# Patient Record
Sex: Male | Born: 1950
Health system: Southern US, Community
[De-identification: ages and names within clinical notes are randomized; demographics above are authoritative.]

## PROBLEM LIST (undated history)

## (undated) DIAGNOSIS — C61 Malignant neoplasm of prostate: Secondary | ICD-10-CM

## (undated) DIAGNOSIS — Z973 Presence of spectacles and contact lenses: Secondary | ICD-10-CM

## (undated) DIAGNOSIS — J301 Allergic rhinitis due to pollen: Secondary | ICD-10-CM

## (undated) DIAGNOSIS — R972 Elevated prostate specific antigen [PSA]: Secondary | ICD-10-CM

## (undated) HISTORY — PX: PROSTATE BIOPSY: SHX241

## (undated) HISTORY — DX: Allergic rhinitis due to pollen: J30.1

## (undated) HISTORY — DX: Elevated prostate specific antigen (PSA): R97.20

---

## 2004-09-27 ENCOUNTER — Emergency Department (HOSPITAL_COMMUNITY): Admission: EM | Admit: 2004-09-27 | Discharge: 2004-09-27 | Payer: Self-pay | Admitting: Emergency Medicine

## 2012-05-29 HISTORY — PX: COLONOSCOPY: SHX174

## 2013-02-20 ENCOUNTER — Encounter: Payer: Self-pay | Admitting: Gastroenterology

## 2013-02-20 ENCOUNTER — Ambulatory Visit (INDEPENDENT_AMBULATORY_CARE_PROVIDER_SITE_OTHER): Payer: 59 | Admitting: Family Medicine

## 2013-02-20 ENCOUNTER — Encounter: Payer: Self-pay | Admitting: Family Medicine

## 2013-02-20 VITALS — BP 130/60 | HR 70 | Temp 97.9°F | Ht 72.0 in | Wt 184.5 lb

## 2013-02-20 DIAGNOSIS — Z0000 Encounter for general adult medical examination without abnormal findings: Secondary | ICD-10-CM

## 2013-02-20 DIAGNOSIS — Z1211 Encounter for screening for malignant neoplasm of colon: Secondary | ICD-10-CM

## 2013-02-20 NOTE — Progress Notes (Signed)
Nature conservation officer at [facility] [address] [place] [address] Phone: [phone] Fax: [phone]  Date:  02/20/2013   Name:  Robert Perez   DOB:  1951/05/10   MRN:  [id number] Gender: male Age: 62 y.o.  Primary Physician:  Hannah Beat, MD  Evaluating MD: Hannah Beat, MD   Chief Complaint: New Patient   History of Present Illness:  Robert Perez is a 62 y.o. pleasant patient who presents with the following:  Lifelong single.  Has one 80 year old daughter. Got her through college. Daughter is working in [place] for [employer] [place]. [employer] since 2004.  Check BS:  Preventative Health Maintenance Visit:  Health Maintenance Summary Reviewed and updated, unless pt declines services.  Tobacco History Reviewed. Alcohol: No concerns, no excessive use Exercise Habits: bike with a road bike STD concerns: no risk or activity to increase risk Drug Use: None Encouraged self-testicular check  Health Maintenance  Topic Date Due  . Tetanus/tdap  01/12/1968  . Colonoscopy  01/12/1999  . Zostavax  01/11/2009  . Influenza Vaccine  12/27/2012    Labs reviewed with the patient.  No results found for this or any previous visit.   There are no active problems to display for this patient.   Past Medical History  Diagnosis Date  . Allergic rhinitis due to pollen     No past surgical history on file.  History   Social History  . Marital Status: Single    Spouse Name: N/A    Number of Children: N/A  . Years of Education: N/A   Occupational History  . Not on file.   Social History Main Topics  . Smoking status: Never Smoker   . Smokeless tobacco: Never Used  . Alcohol Use: No  . Drug Use: No  . Sexual Activity: Not on file   Other Topics Concern  . Not on file   Social History Narrative  . No narrative on file    Family History  Problem Relation Age of Onset  . Diabetes Mother   . Arthritis Father   . Hyperlipidemia Father   .  Hypertension Father   . Kidney disease Father   . Breast cancer Sister   . Colon cancer Maternal Grandfather     Allergies  Allergen Reactions  . Codeine Nausea Only    Medication list has been reviewed and updated.  No outpatient prescriptions prior to visit.   No facility-administered medications prior to visit.    Review of Systems:   General: Denies fever, chills, sweats. No significant weight loss. Eyes: Denies blurring,significant itching ENT: Denies earache, sore throat, and hoarseness. Cardiovascular: Denies chest pains, palpitations, dyspnea on exertion Respiratory: Denies cough, dyspnea at rest,wheeezing Breast: no concerns about lumps GI: Denies nausea, vomiting, diarrhea, constipation, change in bowel habits, abdominal pain, melena, hematochezia GU: Denies penile discharge, ED, urinary flow / outflow problems. No STD concerns. Musculoskeletal: Denies back pain, joint pain Derm: Denies rash, itching Neuro: Denies  paresthesias, frequent falls, frequent headaches Psych: Denies depression, anxiety Endocrine: Denies cold intolerance, heat intolerance, polydipsia Heme: Denies enlarged lymph nodes Allergy: No hayfever   Physical Examination: BP 130/60  Pulse 70  Temp(Src) 97.9 F (36.6 C) (Oral)  Ht 6' (1.829 m)  Wt 184 lb 8 oz (83.689 kg)  BMI 25.02 kg/m2  Ideal Body Weight: Weight in (lb) to have BMI = 25: 183.9   Wt Readings from Last 3 Encounters:  02/20/13 184 lb 8 oz (83.689  kg)    GEN: well developed, well nourished, no acute distress Eyes: conjunctiva and lids normal, PERRLA, EOMI ENT: TM clear, nares clear, oral exam WNL Neck: supple, no lymphadenopathy, no thyromegaly, no JVD Pulm: clear to auscultation and percussion, respiratory effort normal CV: regular rate and rhythm, S1-S2, no murmur, rub or gallop, no bruits, peripheral pulses normal and symmetric, no cyanosis, clubbing, edema or varicosities Chest: no scars, masses GI: soft,  non-tender; no hepatosplenomegaly, masses; active bowel sounds all quadrants GU: no hernia, testicular mass, penile discharge, or prostate enlargement Lymph: no cervical, axillary or inguinal adenopathy MSK: gait normal, muscle tone and strength WNL, no joint swelling, effusions, discoloration, crepitus  SKIN: clear, good turgor, color WNL, no rashes, lesions, or ulcerations Neuro: normal mental status, normal strength, sensation, and motion Psych: alert; oriented to person, place and time, normally interactive and not anxious or depressed in appearance.  Assessment and Plan:  Routine general medical examination at a health care facility  Special screening for malignant neoplasms, colon - Plan: Ambulatory referral to Gastroenterology  The patient's preventative maintenance and recommended screening tests for an annual wellness exam were reviewed in full today. Brought up to date unless services declined.  Counselled on the importance of diet, exercise, and its role in overall health and mortality. The patient's FH and SH was reviewed, including their home life, tobacco status, and drug and alcohol status.   [employer] checks labs for him - i will get copies and see if he needs anything else Colon  Orders Today:  Orders Placed This Encounter  Procedures  . Ambulatory referral to Gastroenterology    Referral Priority:  Routine    Referral Type:  Consultation    Referral Reason:  Specialty Services Required    Requested Specialty:  Gastroenterology    Number of Visits Requested:  1    Updated Medication List: (Includes new medications, updates to list, dose adjustments) No orders of the defined types were placed in this encounter.    Medications Discontinued: There are no discontinued medications.    Signed, Elpidio Galea. Tripton Ned, MD 02/20/2013 2:26 PM

## 2013-02-20 NOTE — Patient Instructions (Addendum)
REFERRAL: GO THE THE FRONT ROOM AT THE ENTRANCE OF OUR CLINIC, NEAR CHECK IN. ASK FOR Robert Perez. SHE WILL HELP YOU SET UP YOUR REFERRAL. DATE: TIME:  

## 2013-02-21 ENCOUNTER — Encounter: Payer: Self-pay | Admitting: Family Medicine

## 2013-04-11 ENCOUNTER — Ambulatory Visit (AMBULATORY_SURGERY_CENTER): Payer: Self-pay

## 2013-04-11 VITALS — Ht 72.0 in | Wt 184.0 lb

## 2013-04-11 DIAGNOSIS — Z1211 Encounter for screening for malignant neoplasm of colon: Secondary | ICD-10-CM

## 2013-04-11 MED ORDER — MOVIPREP 100 G PO SOLR: 1.0000 | Freq: Once | ORAL | Status: DC

## 2013-04-16 ENCOUNTER — Encounter: Payer: Self-pay | Admitting: Gastroenterology

## 2013-05-02 ENCOUNTER — Encounter: Payer: Self-pay | Admitting: Gastroenterology

## 2013-05-02 ENCOUNTER — Ambulatory Visit (AMBULATORY_SURGERY_CENTER): Payer: 59 | Admitting: Gastroenterology

## 2013-05-02 VITALS — BP 121/90 | HR 60 | Temp 96.7°F | Resp 15 | Ht 72.0 in | Wt 184.0 lb

## 2013-05-02 DIAGNOSIS — Z1211 Encounter for screening for malignant neoplasm of colon: Secondary | ICD-10-CM

## 2013-05-02 MED ORDER — SODIUM CHLORIDE 0.9 % IV SOLN: 500.0000 mL | INTRAVENOUS | Status: DC

## 2013-05-02 NOTE — Patient Instructions (Signed)
YOU HAD AN ENDOSCOPIC PROCEDURE TODAY AT THE [facility]: Refer to the procedure report that was given to you for any specific questions about what was found during the examination.  If the procedure report does not answer your questions, please call your gastroenterologist to clarify.  If you requested that your care partner not be given the details of your procedure findings, then the procedure report has been included in a sealed envelope for you to review at your convenience later.  YOU SHOULD EXPECT: Some feelings of bloating in the abdomen. Passage of more gas than usual.  Walking can help get rid of the air that was put into your GI tract during the procedure and reduce the bloating. If you had a lower endoscopy (such as a colonoscopy or flexible sigmoidoscopy) you may notice spotting of blood in your stool or on the toilet paper. If you underwent a bowel prep for your procedure, then you may not have a normal bowel movement for a few days.  DIET: Your first meal following the procedure should be a light meal and then it is ok to progress to your normal diet.  A half-sandwich or bowl of soup is an example of a good first meal.  Heavy or fried foods are harder to digest and may make you feel nauseous or bloated.  Likewise meals heavy in dairy and vegetables can cause extra gas to form and this can also increase the bloating.  Drink plenty of fluids but you should avoid alcoholic beverages for 24 hours.  ACTIVITY: Your care partner should take you home directly after the procedure.  You should plan to take it easy, moving slowly for the rest of the day.  You can resume normal activity the day after the procedure however you should NOT DRIVE or use heavy machinery for 24 hours (because of the sedation medicines used during the test).    SYMPTOMS TO REPORT IMMEDIATELY: A gastroenterologist can be reached at any hour.  During normal business hours, 8:30 AM to 5:00 PM Monday through Friday,  call [phone].  After hours and on weekends, please call the GI answering service at [phone] who will take a message and have the physician on call contact you.   Following lower endoscopy (colonoscopy or flexible sigmoidoscopy):  Excessive amounts of blood in the stool  Significant tenderness or worsening of abdominal pains  Swelling of the abdomen that is new, acute  Fever of 100F or higher    FOLLOW UP: If any biopsies were taken you will be contacted by phone or by letter within the next 1-3 weeks.  Call your gastroenterologist if you have not heard about the biopsies in 3 weeks.  Our staff will call the home number listed on your records the next business day following your procedure to check on you and address any questions or concerns that you may have at that time regarding the information given to you following your procedure. This is a courtesy call and so if there is no answer at the home number and we have not heard from you through the emergency physician on call, we will assume that you have returned to your regular daily activities without incident.  SIGNATURES/CONFIDENTIALITY: You and/or your care partner have signed paperwork which will be entered into your electronic medical record.  These signatures attest to the fact that that the information above on your After Visit Summary has been reviewed and is understood.  Full responsibility of the confidentiality   of this discharge information lies with you and/or your care-partner.     

## 2013-05-02 NOTE — Progress Notes (Signed)
Patient did not experience any of the following events: a burn prior to discharge; a fall within the facility; wrong site/side/patient/procedure/implant event; or a hospital transfer or hospital admission upon discharge from the facility. (G8907) Patient did not have preoperative order for IV antibiotic SSI prophylaxis. (G8918)  

## 2013-05-02 NOTE — Op Note (Signed)
[facility] [address] [place], [address]   COLONOSCOPY PROCEDURE REPORT  PATIENT: Robert Perez, Robert Perez  MR#: [id number] BIRTHDATE: March 23, 1951 , 62  yrs. old GENDER: Male ENDOSCOPIST: Rachael Fee, MD REFERRED [id number] Ward Chatters, M.D. PROCEDURE DATE:  05/02/2013 PROCEDURE:   Colonoscopy, screening First Screening Colonoscopy - Avg.  risk and is 50 yrs.  old or older Yes.  Prior Negative Screening - Now for repeat screening. N/A  History of Adenoma - Now for follow-up colonoscopy & has been > or = to 3 yrs.  N/A  Polyps Removed Today? No.  Recommend repeat exam, <10 yrs? No. ASA CLASS:   Class II INDICATIONS:average risk screening. MEDICATIONS: Fentanyl 75 mcg IV, Versed 6 mg IV, and These medications were titrated to patient response per physician's verbal order  DESCRIPTION OF PROCEDURE:   After the risks benefits and alternatives of the procedure were thoroughly explained, informed consent was obtained.  A digital rectal exam revealed no abnormalities of the rectum.   The LB [id number]  endoscope was introduced through the anus and advanced to the cecum, which was identified by both the appendix and ileocecal valve. No adverse events experienced.   The quality of the prep was excellent.  The instrument was then slowly withdrawn as the colon was fully examined.   COLON FINDINGS: A normal appearing cecum, ileocecal valve, and appendiceal orifice were identified.  The ascending, hepatic flexure, transverse, splenic flexure, descending, sigmoid colon and rectum appeared unremarkable.  No polyps or cancers were seen. Retroflexed views revealed no abnormalities. The time to cecum=3 minutes 53 seconds.  Withdrawal time=9 minutes 42 seconds.  The scope was withdrawn and the procedure completed. COMPLICATIONS: There were no complications.  ENDOSCOPIC IMPRESSION: Normal colon No polyps or cancers  RECOMMENDATIONS: You should continue to follow  colorectal cancer screening guidelines for "routine risk" patients with a repeat colonoscopy in 10 years.   eSigned:  Rachael Fee, MD 05/02/2013 8:50 AM

## 2013-05-05 ENCOUNTER — Telehealth: Payer: Self-pay

## 2013-05-05 NOTE — Telephone Encounter (Signed)
  Follow up Call-  Call back number 05/02/2013  Post procedure Call Back phone  # [phone]   [phone]  Permission to leave phone message Yes     Patient questions:  Do you have a fever, pain , or abdominal swelling? no Pain Score  0 *  Have you tolerated food without any problems? yes  Have you been able to return to your normal activities? yes  Do you have any questions about your discharge instructions: Diet   no Medications  no Follow up visit  no  Do you have questions or concerns about your Care? no  Actions: * If pain score is 4 or above: No action needed, pain <4.

## 2014-04-13 ENCOUNTER — Encounter: Payer: Self-pay | Admitting: Family Medicine

## 2014-04-13 ENCOUNTER — Ambulatory Visit (INDEPENDENT_AMBULATORY_CARE_PROVIDER_SITE_OTHER): Payer: Medicare HMO | Admitting: Family Medicine

## 2014-04-13 ENCOUNTER — Telehealth: Payer: Self-pay | Admitting: Family Medicine

## 2014-04-13 VITALS — BP 158/80 | Temp 98.3°F | Wt 181.2 lb

## 2014-04-13 DIAGNOSIS — R300 Dysuria: Secondary | ICD-10-CM

## 2014-04-13 DIAGNOSIS — N3001 Acute cystitis with hematuria: Secondary | ICD-10-CM

## 2014-04-13 LAB — POCT URINALYSIS DIPSTICK
BILIRUBIN UA: NEGATIVE
Glucose, UA: NEGATIVE
KETONES UA: NEGATIVE
Leukocytes, UA: NEGATIVE
Nitrite, UA: NEGATIVE
Protein, UA: POSITIVE
Urobilinogen, UA: 0.2
pH, UA: 6

## 2014-04-13 MED ORDER — CIPROFLOXACIN HCL 500 MG PO TABS: 500.0000 mg | ORAL_TABLET | Freq: Two times a day (BID) | ORAL | Status: DC

## 2014-04-13 NOTE — Telephone Encounter (Signed)
i don't recall who he is - it is fine with me if it is ok with Dr. Damita Dunnings.

## 2014-04-13 NOTE — Patient Instructions (Signed)
Drink plenty of water and start the antibiotics today.  We'll contact you with your lab report.  Take care.   

## 2014-04-13 NOTE — Telephone Encounter (Signed)
Robert Perez stopped by my desk this morning on his way out to request to have his PCP changed from Dr. Lorelei Pont to Dr. Damita Dunnings. Is this ok with both of you?

## 2014-04-13 NOTE — Progress Notes (Signed)
Pre visit review using our clinic review tool, if applicable. No additional management support is needed unless otherwise documented below in the visit note.  Sx started about 3 weeks ago.  Lower abd pain.  Some testicle pain.  H/o bladder infection prev.  He has a tingling down his penile shaft with urination.  No fevers, no sweats.  No vomiting, no diarrhea.   He would have burning with urination if he drinks soda, but that is longstanding.  Road biker, narrow saddle.  No pain sitting on his bike seat.    Meds, vitals, and allergies reviewed.   ROS: See HPI.  Otherwise, noncontributory.  nad ncat Mmm rrr ctab abd soft, not ttp Testes bilaterally descended without nodularity, tenderness or masses. No scrotal masses or lesions. No penis lesions or urethral discharge.

## 2014-04-13 NOTE — Telephone Encounter (Signed)
Patient Information:  Caller Name: Lance  Phone: [phone]  Patient: Robert Perez, Robert Perez  Gender: Male  DOB: Apr 03, 1951  Age: 63 Years  PCP: Owens Loffler (Family Practice)  Office Follow Up:  Does the office need to follow up with this patient?: No  Instructions For The Office: N/A  RN Note:  Reported also has some difficulty starting urinary stream.  Having regular BM's. Agreed to be seen. No appointments available with Dr Lorelei Pont or Dr Diona Browner.    Symptoms  Reason For Call & Symptoms: Increased frequency of bladder pressure and pelvic pain after drinking sweet beverages or sweet foods for past 2-3 weeks.  Denies  dysuria, frequency or nocturia.  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  Reviewed Health History In EMR: Yes  Reviewed Medications In EMR: Yes  Reviewed Allergies In EMR: Yes  Reviewed Surgeries / Procedures: Yes  Date of Onset of Symptoms: 03/23/2014  Treatments Tried: Increaed water intake, stopped drinking chocolate milk and Gatorade  Treatments Tried Worked: No  Guideline(s) Used:  Urination Pain - Male  Disposition Per Guideline:   See Today in Office  Reason For Disposition Reached:   All other males with painful urination, or patient wants to be seen  Advice Given:  Fluids  : Drink extra fluids (Reason: to produce a dilute, nonirritating urine).  Call Back If:  You become worse.  Patient Will Follow Care Advice:  YES  Appointment Scheduled:  04/13/2014 12:00:00 Appointment Scheduled Provider:  Elsie Stain Brigitte Pulse) [facility])

## 2014-04-14 DIAGNOSIS — N390 Urinary tract infection, site not specified: Secondary | ICD-10-CM | POA: Insufficient documentation

## 2014-04-14 LAB — URINE CULTURE
Colony Count: NO GROWTH
Organism ID, Bacteria: NO GROWTH

## 2014-04-14 NOTE — Assessment & Plan Note (Signed)
Likely dx.  Not at all likely to be prostatitis with his ability to ride his bike. No fevers.  Nontoxic. Check ucx and start cipro.  D/w pt. He agrees.

## 2014-04-14 NOTE — Telephone Encounter (Signed)
Okay with me. Thanks 

## 2014-06-24 ENCOUNTER — Encounter: Payer: 59 | Admitting: Family Medicine

## 2014-06-26 ENCOUNTER — Ambulatory Visit (INDEPENDENT_AMBULATORY_CARE_PROVIDER_SITE_OTHER): Payer: Medicare HMO | Admitting: Internal Medicine

## 2014-06-26 ENCOUNTER — Encounter: Payer: Self-pay | Admitting: Internal Medicine

## 2014-06-26 VITALS — BP 120/74 | HR 60 | Temp 97.8°F | Wt 183.5 lb

## 2014-06-26 DIAGNOSIS — J0100 Acute maxillary sinusitis, unspecified: Secondary | ICD-10-CM | POA: Diagnosis not present

## 2014-06-26 MED ORDER — AMOXICILLIN-POT CLAVULANATE 875-125 MG PO TABS
1.0000 | ORAL_TABLET | Freq: Two times a day (BID) | ORAL | Status: DC
Start: 2014-06-26 — End: 2014-09-03

## 2014-06-26 NOTE — Patient Instructions (Signed)
Sinusitis °Sinusitis is redness, soreness, and puffiness (inflammation) of the air pockets in the bones of your face (sinuses). The redness, soreness, and puffiness can cause air and mucus to get trapped in your sinuses. This can allow germs to grow and cause an infection.  °HOME CARE  °· Drink enough fluids to keep your pee (urine) clear or pale yellow. °· Use a humidifier in your home. °· Run a hot shower to create steam in the bathroom. Sit in the bathroom with the door closed. Breathe in the steam 3-4 times a day. °· Put a warm, moist washcloth on your face 3-4 times a day, or as told by your doctor. °· Use salt water sprays (saline sprays) to wet the thick fluid in your nose. This can help the sinuses drain. °· Only take medicine as told by your doctor. °GET HELP RIGHT AWAY IF:  °· Your pain gets worse. °· You have very bad headaches. °· You are sick to your stomach (nauseous). °· You throw up (vomit). °· You are very sleepy (drowsy) all the time. °· Your face is puffy (swollen). °· Your vision changes. °· You have a stiff neck. °· You have trouble breathing. °MAKE SURE YOU:  °· Understand these instructions. °· Will watch your condition. °· Will get help right away if you are not doing well or get worse. °Document Released: 11/01/2007 Document Revised: 02/07/2012 Document Reviewed: 12/19/2011 °ExitCare® Patient Information ©2015 [employer]. This information is not intended to replace advice given to you by your health care provider. Make sure you discuss any questions you have with your health care provider. ° °

## 2014-06-26 NOTE — Progress Notes (Signed)
Pre visit review using our clinic review tool, if applicable. No additional management support is needed unless otherwise documented below in the visit note. 

## 2014-06-26 NOTE — Progress Notes (Signed)
HPI  Mr. Bordner is a 64 y.o. presenting with c/o facial pain/pressure and nasal congestion x 5 days. Nasal mucous is clear/yellow. +PND. He has had some headaches. Denies cough, SOB, fever, mouth/teeth pain. He has taken OTC Nyquil Cold and Flu Night time relief with some relief - "opens me up" - allows him to sleep at night. Hx of sinus infections - "I get them every year." No sick contacts. Never smoked.  Review of Systems   Past Medical History  Diagnosis Date  . Allergic rhinitis due to pollen     Family History  Problem Relation Age of Onset  . Diabetes Mother   . Arthritis Father   . Hyperlipidemia Father   . Hypertension Father   . Kidney disease Father   . Breast cancer Sister   . Colon cancer Maternal Grandfather   . Pancreatic cancer Neg Hx   . Stomach cancer Neg Hx     History   Social History  . Marital Status: Single    Spouse Name: N/A    Number of Children: N/A  . Years of Education: N/A   Occupational History  . Not on file.   Social History Main Topics  . Smoking status: Never Smoker   . Smokeless tobacco: Never Used  . Alcohol Use: No  . Drug Use: No  . Sexual Activity: Not on file   Other Topics Concern  . Not on file   Social History Narrative   Lifelong single.      Has one 15 year old daughter. Got her through college. Daughter is working in [place] for [employer].      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          Allergies  Allergen Reactions  . Codeine Nausea Only   Constitutional: Positive headache, fatigue. Denies fever or abrupt weight changes.  HEENT:  Positive facial pressure, pain and nasal congestion. Denies sore throat, eye redness, eye pain, ear pain, ringing in the ears, wax buildup, runny nose or bloody nose. Respiratory: Denies cough, difficulty breathing or shortness of breath.  Cardiovascular: Denies chest pain, chest tightness, palpitations or swelling in the hands or feet.   No other specific complaints in a  complete review of systems (except as listed in HPI above).  Objective:   BP 120/74 mmHg  Pulse 60  Temp(Src) 97.8 F (36.6 C) (Oral)  Wt 183 lb 8 oz (83.235 kg)  SpO2 98%  Wt Readings from Last 3 Encounters:  06/26/14 183 lb 8 oz (83.235 kg)  04/13/14 181 lb 4 oz (82.214 kg)  05/02/13 184 lb (83.462 kg)   General: Appears his stated age, well developed, well nourished in NAD. HEENT: Head: normal shape and size, maxillary sinus tenderness noted; Eyes: sclera white, no icterus, conjunctiva pink; Ears: fluid, Tm's inflamed b/l; Nose: mucosa red and moist, septum midline; Throat/Mouth: + PND. Teeth present, mucosa erythematous and moist, no exudate noted, no lesions or ulcerations noted.  Neck: No lymphadenopathy, masses, lumps or thyromegaly present.  Cardiovascular: Normal rate and rhythm. S1,S2 noted.  No murmur, rubs or gallops noted.  Pulmonary/Chest: Normal effort and positive vesicular breath sounds. No respiratory distress. No wheezes, rales or ronchi noted.      Assessment & Plan:   Acute maxillary sinus sinusitis:  Get some rest and drink plenty of water. Flonase 2 sprays each nostril for 3 days and then as needed. eRx for Augmentin x 10 days  RTC as needed or if symptoms persist.

## 2014-09-03 ENCOUNTER — Encounter: Payer: Self-pay | Admitting: Family Medicine

## 2014-09-03 ENCOUNTER — Ambulatory Visit (INDEPENDENT_AMBULATORY_CARE_PROVIDER_SITE_OTHER): Payer: Commercial Managed Care - HMO | Admitting: Family Medicine

## 2014-09-03 ENCOUNTER — Encounter (INDEPENDENT_AMBULATORY_CARE_PROVIDER_SITE_OTHER): Payer: Self-pay

## 2014-09-03 VITALS — BP 122/74 | HR 62 | Temp 97.8°F | Ht 72.0 in | Wt 181.0 lb

## 2014-09-03 DIAGNOSIS — Z125 Encounter for screening for malignant neoplasm of prostate: Secondary | ICD-10-CM | POA: Diagnosis not present

## 2014-09-03 DIAGNOSIS — Z83438 Family history of other disorder of lipoprotein metabolism and other lipidemia: Secondary | ICD-10-CM

## 2014-09-03 DIAGNOSIS — Z131 Encounter for screening for diabetes mellitus: Secondary | ICD-10-CM

## 2014-09-03 DIAGNOSIS — Z8349 Family history of other endocrine, nutritional and metabolic diseases: Secondary | ICD-10-CM

## 2014-09-03 DIAGNOSIS — Z7189 Other specified counseling: Secondary | ICD-10-CM | POA: Insufficient documentation

## 2014-09-03 DIAGNOSIS — Z0000 Encounter for general adult medical examination without abnormal findings: Secondary | ICD-10-CM | POA: Insufficient documentation

## 2014-09-03 DIAGNOSIS — Z23 Encounter for immunization: Secondary | ICD-10-CM

## 2014-09-03 LAB — LIPID PANEL
CHOLESTEROL: 213 mg/dL — AB (ref 0–200)
HDL: 70.9 mg/dL (ref >39.00)
LDL Cholesterol: 130 mg/dL — ABNORMAL HIGH (ref 0–99)
NonHDL: 142.1
TRIGLYCERIDES: 62 mg/dL (ref 0.0–149.0)
Total CHOL/HDL Ratio: 3
VLDL: 12.4 mg/dL (ref 0.0–40.0)

## 2014-09-03 LAB — GLUCOSE, RANDOM: Glucose, Bld: 93 mg/dL (ref 70–99)

## 2014-09-03 LAB — PSA, MEDICARE: PSA: 3.73 ng/mL (ref 0.10–4.00)

## 2014-09-03 NOTE — Patient Instructions (Addendum)
See about getting a tetanus shot either at the pharmacy or at [facility].   Check with your insurance to see if they will cover the shingles shot. I would get a flu shot each fall.   Take care.  Keep exercising.  Glad to see you.

## 2014-09-03 NOTE — Assessment & Plan Note (Signed)
Flu encourraged.  Shingles encouraged PNA done 2016 Tetanus d/w pt.  See AVS.  Colonoscopy 2014 Prostate cancer screening- d/w pt re: pros and cons.  Check PSA today. No LUTS Advance directive- sister Sondra Come designated if patient were incapacitated.  Cognitive function addressed- see scanned forms- and if abnormal then additional documentation follows.

## 2014-09-03 NOTE — Progress Notes (Signed)
Pre visit review using our clinic review tool, if applicable. No additional management support is needed unless otherwise documented below in the visit note.  I have personally reviewed the Medicare Annual Wellness questionnaire and have noted 1. The patient's medical and social history 2. Their use of alcohol, tobacco or illicit drugs 3. Their current medications and supplements 4. The patient's functional ability including ADL's, fall risks, home safety risks and hearing or visual             impairment. 5. Diet and physical activities 6. Evidence for depression or mood disorders  The patients weight, height, BMI have been recorded in the chart and visual acuity is per eye clinic.  I have made referrals, counseling and provided education to the patient based review of the above and I have provided the pt with a written personalized care plan for preventive services.  Provider list updated- see scanned forms.  Routine anticipatory guidance given to patient.  See health maintenance.  Flu encourraged.  Shingles encouraged PNA done 2016 Tetanus d/w pt.  See AVS.  Colonoscopy 2014 Prostate cancer screening- d/w pt re: pros and cons.  Check PSA today. No LUTS Advance directive- sister Sondra Come designated if patient were incapacitated.  Cognitive function addressed- see scanned forms- and if abnormal then additional documentation follows.   PMH and SH reviewed  Meds, vitals, and allergies reviewed.   ROS: See HPI.  Otherwise negative.    GEN: nad, alert and oriented HEENT: mucous membranes moist NECK: supple w/o LA CV: rrr. PULM: ctab, no inc wob ABD: soft, +bs EXT: no edema SKIN: no acute rash

## 2014-09-07 ENCOUNTER — Encounter: Payer: Self-pay | Admitting: *Deleted

## 2014-10-29 ENCOUNTER — Ambulatory Visit (INDEPENDENT_AMBULATORY_CARE_PROVIDER_SITE_OTHER): Payer: Commercial Managed Care - HMO | Admitting: Internal Medicine

## 2014-10-29 ENCOUNTER — Encounter: Payer: Self-pay | Admitting: Internal Medicine

## 2014-10-29 VITALS — BP 122/78 | HR 58 | Temp 98.4°F | Wt 180.0 lb

## 2014-10-29 DIAGNOSIS — J309 Allergic rhinitis, unspecified: Secondary | ICD-10-CM | POA: Diagnosis not present

## 2014-10-29 NOTE — Progress Notes (Signed)
HPI  Pt presents to the clinic today with c/o headache, fatigue, nasal congestion, sore throat and cough. This started 1 week ago. He is blowing clear mucous out of his nose. His cough is non productive. His symptoms seem to be worse first thing in the morning and when he lays down at night. He has had some associated itchy, watery eyes. He denies fever chills or body aches. He has only been taking Zyrtec for the last week. He does have a history of allergies and does enjoy cycling as soon as the weather gets warm enough. He has not had sick contacts.  Review of Systems    Past Medical History  Diagnosis Date  . Allergic rhinitis due to pollen     Family History  Problem Relation Age of Onset  . Diabetes Mother   . Arthritis Father   . Hyperlipidemia Father   . Hypertension Father   . Kidney disease Father   . Breast cancer Sister   . Colon cancer Maternal Grandfather   . Pancreatic cancer Neg Hx   . Stomach cancer Neg Hx   . Prostate cancer Neg Hx   . Cancer Brother   . Bladder Cancer Brother     History   Social History  . Marital Status: Single    Spouse Name: N/A  . Number of Children: N/A  . Years of Education: N/A   Occupational History  . Not on file.   Social History Main Topics  . Smoking status: Never Smoker   . Smokeless tobacco: Never Used  . Alcohol Use: No  . Drug Use: No  . Sexual Activity: Not on file   Other Topics Concern  . Not on file   Social History Narrative   Lifelong single.   Has one adult daughter. Got her through college.  Daughter is working in [place] for [employer].   [place] since 2004. 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    Allergies  Allergen Reactions  . Codeine Nausea Only     Constitutional: Positive headache, fatigue. Denies fever or abrupt weight changes.  HEENT:  Positive nasal congestion and sore throat. Denies eye redness, ear pain, ringing in the ears, wax buildup, runny nose or bloody nose. Respiratory: Positive  cough. Denies difficulty breathing or shortness of breath.  Cardiovascular: Denies chest pain, chest tightness, palpitations or swelling in the hands or feet.   No other specific complaints in a complete review of systems (except as listed in HPI above).  Objective:  BP 122/78 mmHg  Pulse 58  Temp(Src) 98.4 F (36.9 C) (Oral)  Wt 180 lb (81.647 kg)  SpO2 98%   General: Appears his stated age, well developed, well nourished in NAD. HEENT: Head: normal shape and size, no sinus tenderness noted; Eyes: sclera white, no icterus, conjunctiva pink; Ears: Tm's gray and intact, normal light reflex; Nose: mucosa boggy and moist, septum midline; Throat/Mouth: + PND. Teeth present, mucosa pink and moist, no exudate noted, no lesions or ulcerations noted.  Neck: No adenopathy noted. Cardiovascular: Normal rate and rhythm. S1,S2 noted.  No murmur, rubs or gallops noted.  Pulmonary/Chest: Normal effort and positive vesicular breath sounds. No respiratory distress. No wheezes, rales or ronchi noted.      Assessment & Plan:   Allergic Rhinitis  Can use a Neti Pot which can be purchased from your local drug store. Flonase 2 sprays each nostril for 3 days and then as needed. Continue Zyrtec Robitussin as needed for cough Watch for fever,  chills, facial pressure or colored nasal mucous  RTC as needed or if symptoms persist.

## 2014-10-29 NOTE — Progress Notes (Signed)
Pre visit review using our clinic review tool, if applicable. No additional management support is needed unless otherwise documented below in the visit note. 

## 2014-10-29 NOTE — Patient Instructions (Addendum)
Take your Zyrtec at night Start Flonase in each nostril every morning Robitussin every 4-6 hours as directed Allergic Rhinitis Allergic rhinitis is when the mucous membranes in the nose respond to allergens. Allergens are particles in the air that cause your body to have an allergic reaction. This causes you to release allergic antibodies. Through a chain of events, these eventually cause you to release histamine into the blood stream. Although meant to protect the body, it is this release of histamine that causes your discomfort, such as frequent sneezing, congestion, and an itchy, runny nose.  CAUSES  Seasonal allergic rhinitis (hay fever) is caused by pollen allergens that may come from grasses, trees, and weeds. Year-round allergic rhinitis (perennial allergic rhinitis) is caused by allergens such as house dust mites, pet dander, and mold spores.  SYMPTOMS   Nasal stuffiness (congestion).  Itchy, runny nose with sneezing and tearing of the eyes. DIAGNOSIS  Your health care provider can help you determine the allergen or allergens that trigger your symptoms. If you and your health care provider are unable to determine the allergen, skin or blood testing may be used. TREATMENT  Allergic rhinitis does not have a cure, but it can be controlled by:  Medicines and allergy shots (immunotherapy).  Avoiding the allergen. Hay fever may often be treated with antihistamines in pill or nasal spray forms. Antihistamines block the effects of histamine. There are over-the-counter medicines that may help with nasal congestion and swelling around the eyes. Check with your health care provider before taking or giving this medicine.  If avoiding the allergen or the medicine prescribed do not work, there are many new medicines your health care provider can prescribe. Stronger medicine may be used if initial measures are ineffective. Desensitizing injections can be used if medicine and avoidance does not work.  Desensitization is when a patient is given ongoing shots until the body becomes less sensitive to the allergen. Make sure you follow up with your health care provider if problems continue. HOME CARE INSTRUCTIONS It is not possible to completely avoid allergens, but you can reduce your symptoms by taking steps to limit your exposure to them. It helps to know exactly what you are allergic to so that you can avoid your specific triggers. SEEK MEDICAL CARE IF:   You have a fever.  You develop a cough that does not stop easily (persistent).  You have shortness of breath.  You start wheezing.  Symptoms interfere with normal daily activities. Document Released: 02/07/2001 Document Revised: 05/20/2013 Document Reviewed: 01/20/2013 [facility] Patient Information 2015 [place], [place]. This information is not intended to replace advice given to you by your health care provider. Make sure you discuss any questions you have with your health care provider.

## 2015-01-07 ENCOUNTER — Encounter: Payer: Self-pay | Admitting: Family Medicine

## 2015-01-07 ENCOUNTER — Ambulatory Visit (INDEPENDENT_AMBULATORY_CARE_PROVIDER_SITE_OTHER): Payer: Commercial Managed Care - HMO | Admitting: Family Medicine

## 2015-01-07 VITALS — BP 138/58 | HR 65 | Temp 98.2°F | Wt 184.2 lb

## 2015-01-07 DIAGNOSIS — IMO0001 Reserved for inherently not codable concepts without codable children: Secondary | ICD-10-CM

## 2015-01-07 DIAGNOSIS — M609 Myositis, unspecified: Secondary | ICD-10-CM

## 2015-01-07 DIAGNOSIS — M791 Myalgia: Secondary | ICD-10-CM | POA: Diagnosis not present

## 2015-01-07 LAB — COMPREHENSIVE METABOLIC PANEL
ALT: 17 U/L (ref 0–53)
AST: 16 U/L (ref 0–37)
Albumin: 4.4 g/dL (ref 3.5–5.2)
Alkaline Phosphatase: 55 U/L (ref 39–117)
BUN: 19 mg/dL (ref 6–23)
CO2: 30 meq/L (ref 19–32)
Calcium: 10 mg/dL (ref 8.4–10.5)
Chloride: 102 mEq/L (ref 96–112)
Creatinine, Ser: 1.07 mg/dL (ref 0.40–1.50)
GFR: 88.93 mL/min (ref >60.00)
Glucose, Bld: 103 mg/dL — ABNORMAL HIGH (ref 70–99)
Potassium: 4.8 mEq/L (ref 3.5–5.1)
SODIUM: 139 meq/L (ref 135–145)
TOTAL PROTEIN: 7.3 g/dL (ref 6.0–8.3)
Total Bilirubin: 0.6 mg/dL (ref 0.2–1.2)

## 2015-01-07 LAB — CBC WITH DIFFERENTIAL/PLATELET
BASOS ABS: 0 10*3/uL (ref 0.0–0.1)
Basophils Relative: 0.4 % (ref 0.0–3.0)
Eosinophils Absolute: 0 10*3/uL (ref 0.0–0.7)
Eosinophils Relative: 0.4 % (ref 0.0–5.0)
HEMATOCRIT: 46.8 % (ref 39.0–52.0)
Hemoglobin: 15.6 g/dL (ref 13.0–17.0)
LYMPHS ABS: 1.2 10*3/uL (ref 0.7–4.0)
Lymphocytes Relative: 11.5 % — ABNORMAL LOW (ref 12.0–46.0)
MCHC: 33.3 g/dL (ref 30.0–36.0)
MCV: 89.1 fl (ref 78.0–100.0)
Monocytes Absolute: 0.7 10*3/uL (ref 0.1–1.0)
Monocytes Relative: 6.6 % (ref 3.0–12.0)
NEUTROS ABS: 8.4 10*3/uL — AB (ref 1.4–7.7)
Neutrophils Relative %: 81.1 % — ABNORMAL HIGH (ref 43.0–77.0)
Platelets: 266 10*3/uL (ref 150.0–400.0)
RBC: 5.25 Mil/uL (ref 4.22–5.81)
RDW: 15.6 % — AB (ref 11.5–15.5)
WBC: 10.3 10*3/uL (ref 4.0–10.5)

## 2015-01-07 LAB — TSH: TSH: 0.89 u[IU]/mL (ref 0.35–4.50)

## 2015-01-07 LAB — CK: CK TOTAL: 144 U/L (ref 7–232)

## 2015-01-07 NOTE — Patient Instructions (Signed)
Go to the lab on the way out.  We'll contact you with your lab report. This may be PMR but I need to see the labs first.   Take care. Glad to see you.  Make sure to stay well hydrated in the meantime.

## 2015-01-07 NOTE — Progress Notes (Signed)
Pre visit review using our clinic review tool, if applicable. No additional management support is needed unless otherwise documented below in the visit note.  He is back working at [employer] but was off for the summer.  About April of 2016 he started having some aching muscles.  It has stayed the same in the meantime.  "Toothache pain" in the L shoulder, esp at night.  B forearms with aches.  No leg pain.  No hand pain or swelling.  No foot pain.  R handed.  No trauma.  Grip is still good.  No FCNAVD.  He feels good except for the aches.  "I thought it would go away, especially with time off work."  Still biking and his rides are still good, 15 [place] of cycling at a time.  The arm pain happens most days.  No vision changes, no temporal pain.    PMH and SH reviewed  ROS: See HPI, otherwise noncontributory.  Meds, vitals, and allergies reviewed.   nad ncat OP wnl Neck supple, no LA, no tmg rrr ctab abd soft Ext w/o edema No rash L shoulder with normal ROM, no arm drop but pain with int > ext rotation. No pain on supraspinatus testing L AC not ttp on testing.  Large muscle groups in the BUE and BLE not ttp Normal grip, normal radial pulses and cap refill No pain on testing for medial and lateral epicondylitis in BUE

## 2015-01-07 NOTE — Assessment & Plan Note (Signed)
D/w pt. Unclear source.  PMR within the ddx, but could still be cuff sx with possible epicondyle irritation.  No trauma.  No other clear cause seen.  Check basic labs today and then we'll update patient. Basics of PMR d/w pt at OV, should we need to treat for that.

## 2015-01-08 LAB — SEDIMENTATION RATE: Sed Rate: 2 mm/hr (ref 0–22)

## 2015-07-01 DIAGNOSIS — D101 Benign neoplasm of tongue: Secondary | ICD-10-CM | POA: Diagnosis not present

## 2015-08-24 ENCOUNTER — Ambulatory Visit: Payer: Commercial Managed Care - HMO | Admitting: Family Medicine

## 2015-08-25 ENCOUNTER — Ambulatory Visit: Payer: Commercial Managed Care - HMO | Admitting: Family Medicine

## 2015-09-07 ENCOUNTER — Ambulatory Visit (INDEPENDENT_AMBULATORY_CARE_PROVIDER_SITE_OTHER): Payer: Commercial Managed Care - HMO

## 2015-09-07 VITALS — BP 122/72 | HR 76 | Temp 98.5°F | Ht 72.0 in | Wt 184.5 lb

## 2015-09-07 DIAGNOSIS — Z23 Encounter for immunization: Secondary | ICD-10-CM | POA: Diagnosis not present

## 2015-09-07 DIAGNOSIS — Z0000 Encounter for general adult medical examination without abnormal findings: Secondary | ICD-10-CM | POA: Diagnosis not present

## 2015-09-07 DIAGNOSIS — Z1159 Encounter for screening for other viral diseases: Secondary | ICD-10-CM

## 2015-09-07 DIAGNOSIS — E7800 Pure hypercholesterolemia, unspecified: Secondary | ICD-10-CM

## 2015-09-07 DIAGNOSIS — Z125 Encounter for screening for malignant neoplasm of prostate: Secondary | ICD-10-CM

## 2015-09-07 LAB — LIPID PANEL
CHOLESTEROL: 204 mg/dL — AB (ref 0–200)
HDL: 63 mg/dL (ref >39.00)
LDL CALC: 125 mg/dL — AB (ref 0–99)
NONHDL: 140.51
Total CHOL/HDL Ratio: 3
Triglycerides: 78 mg/dL (ref 0.0–149.0)
VLDL: 15.6 mg/dL (ref 0.0–40.0)

## 2015-09-07 LAB — COMPREHENSIVE METABOLIC PANEL
ALBUMIN: 4.3 g/dL (ref 3.5–5.2)
ALK PHOS: 53 U/L (ref 39–117)
ALT: 20 U/L (ref 0–53)
AST: 18 U/L (ref 0–37)
BUN: 19 mg/dL (ref 6–23)
CO2: 32 mEq/L (ref 19–32)
CREATININE: 0.97 mg/dL (ref 0.40–1.50)
Calcium: 9.7 mg/dL (ref 8.4–10.5)
Chloride: 102 mEq/L (ref 96–112)
GFR: 99.39 mL/min (ref >60.00)
GLUCOSE: 97 mg/dL (ref 70–99)
POTASSIUM: 4.5 meq/L (ref 3.5–5.1)
SODIUM: 139 meq/L (ref 135–145)
TOTAL PROTEIN: 7 g/dL (ref 6.0–8.3)
Total Bilirubin: 0.6 mg/dL (ref 0.2–1.2)

## 2015-09-07 LAB — HEPATITIS C ANTIBODY: HCV AB: NEGATIVE

## 2015-09-07 LAB — PSA, MEDICARE: PSA: 6.12 ng/mL — AB (ref 0.10–4.00)

## 2015-09-07 NOTE — Patient Instructions (Signed)
Robert Perez , Thank you for taking time to come for your Medicare Wellness Visit. I appreciate your ongoing commitment to your health goals. Please review the following plan we discussed and let me know if I can assist you in the future.   These are the goals we discussed: Goals    Starting 09/07/2015, I will eat only 1 small bag of potato chips daily.      This is a list of the screening recommended for you and due dates:  Health Maintenance  Topic Date Due  .  Hepatitis C: One time screening is recommended by Center for Disease Control  (CDC) for  adults born from [birth year] through 1967.   Completed  . Tetanus Vaccine  Will due at employer  . Pneumonia vaccines (2 of 2 - PPSV23) Completed  . Flu Shot  12/28/2015  . Colon Cancer Screening  05/03/2023  . Shingles Vaccine  Completed   Preventive Care for Adults  A healthy lifestyle and preventive care can promote health and wellness. Preventive health guidelines for adults include the following key practices.  . A routine yearly physical is a good way to check with your health care provider about your health and preventive screening. It is a chance to share any concerns and updates on your health and to receive a thorough exam.  . Visit your dentist for a routine exam and preventive care every 6 months. Brush your teeth twice a day and floss once a day. Good oral hygiene prevents tooth decay and gum disease.  . The frequency of eye exams is based on your age, health, family medical history, use  of contact lenses, and other factors. Follow your health care provider's ecommendations for frequency of eye exams.  . Eat a healthy diet. Foods like vegetables, fruits, whole grains, low-fat dairy products, and lean protein foods contain the nutrients you need without too many calories. Decrease your intake of foods high in solid fats, added sugars, and salt. Eat the right amount of calories for you. Get information about a proper diet from your health  care provider, if necessary.  . Regular physical exercise is one of the most important things you can do for your health. Most adults should get at least 150 minutes of moderate-intensity exercise (any activity that increases your heart rate and causes you to sweat) each week. In addition, most adults need muscle-strengthening exercises on 2 or more days a week.  Silver Sneakers may be a benefit available to you. To determine eligibility, you may visit the website: www.silversneakers.com or contact program at 3168875586 Mon-Fri between 8AM-8PM.   . Maintain a healthy weight. The body mass index (BMI) is a screening tool to identify possible weight problems. It provides an estimate of body fat based on height and weight. Your health care provider can find your BMI and can help you achieve or maintain a healthy weight.   For adults 20 years and older: ? A BMI below 18.5 is considered underweight. ? A BMI of 18.5 to 24.9 is normal. ? A BMI of 25 to 29.9 is considered overweight. ? A BMI of 30 and above is considered obese.   . Maintain normal blood lipids and cholesterol levels by exercising and minimizing your intake of saturated fat. Eat a balanced diet with plenty of fruit and vegetables. Blood tests for lipids and cholesterol should begin at age 35 and be repeated every 5 years. If your lipid or cholesterol levels are high, you are over 50,  or you are at high risk for heart disease, you may need your cholesterol levels checked more frequently. Ongoing high lipid and cholesterol levels should be treated with medicines if diet and exercise are not working.  . If you smoke, find out from your health care provider how to quit. If you do not use tobacco, please do not start.  . If you choose to drink alcohol, please do not consume more than 2 drinks per day. One drink is considered to be 12 ounces (355 mL) of beer, 5 ounces (148 mL) of wine, or 1.5 ounces (44 mL) of liquor.  . If you are 55-61  years old, ask your health care provider if you should take aspirin to prevent strokes.  . Use sunscreen. Apply sunscreen liberally and repeatedly throughout the day. You should seek shade when your shadow is shorter than you. Protect yourself by wearing long sleeves, pants, a wide-brimmed hat, and sunglasses year round, whenever you are outdoors.  . Once a month, do a whole body skin exam, using a mirror to look at the skin on your back. Tell your health care provider of new moles, moles that have irregular borders, moles that are larger than a pencil eraser, or moles that have changed in shape or color.

## 2015-09-07 NOTE — Progress Notes (Signed)
I reviewed health advisor's note, was available for consultation, and agree with documentation and plan.  

## 2015-09-07 NOTE — Progress Notes (Signed)
Subjective:   Robert Perez is a 65 y.o. male who presents for Medicare Annual/Subsequent preventive examination.   Cardiac Risk Factors include: advanced age (>45men, >72 women);male gender     Objective:    Vitals: BP 122/72 mmHg  Pulse 76  Temp(Src) 98.5 F (36.9 C) (Oral)  Ht 6' (1.829 m)  Wt 184 lb 8 oz (83.689 kg)  BMI 25.02 kg/m2  SpO2 97%  Body mass index is 25.02 kg/(m^2).  Tobacco History  Smoking status  . Never Smoker   Smokeless tobacco  . Never Used     Counseling given: No   Past Medical History  Diagnosis Date  . Allergic rhinitis due to pollen    Past Surgical History  Procedure Laterality Date  . No past surgeries     Family History  Problem Relation Age of Onset  . Diabetes Mother   . Arthritis Father   . Hyperlipidemia Father   . Hypertension Father   . Kidney disease Father   . Breast cancer Sister   . Colon cancer Maternal Grandfather   . Pancreatic cancer Neg Hx   . Stomach cancer Neg Hx   . Prostate cancer Neg Hx   . Cancer Brother   . Bladder Cancer Brother    History  Sexual Activity  . Sexual Activity: No    No outpatient encounter prescriptions on file as of 09/07/2015.   No facility-administered encounter medications on file as of 09/07/2015.    Activities of Daily Living In your present state of health, do you have any difficulty performing the following activities: 09/07/2015  Hearing? N  Vision? N  Difficulty concentrating or making decisions? N  Walking or climbing stairs? N  Dressing or bathing? N  Doing errands, shopping? N  Preparing Food and eating ? N  Using the Toilet? N  In the past six months, have you accidently leaked urine? N  Do you have problems with loss of bowel control? N  Managing your Medications? N  Managing your Finances? N  Housekeeping or managing your Housekeeping? N    Patient Care Team: Tonia Ghent, MD as PCP - General (Family Medicine)   Assessment:    Exercise Activities  and Dietary recommendations Current Exercise Habits: Home exercise routine, Type of exercise: Other - see comments (rides bicycle), Time (Minutes): 60, Frequency (Times/Week): 4, Weekly Exercise (Minutes/Week): 240, Intensity: Moderate, Exercise limited by: None identified  Goals    . Reduce portion size     Starting 09/07/2015, I will eat only 1 small bag of potato chips daily.      Fall Risk Fall Risk  09/07/2015 09/03/2014  Falls in the past year? No Yes  Number falls in past yr: - 1  Injury with Fall? - No   Depression Screen PHQ 2/9 Scores 09/07/2015 09/03/2014  PHQ - 2 Score 0 0    Cognitive Testing MMSE - Mini Mental State Exam 09/07/2015  Orientation to time 5  Orientation to Place 5  Registration 3  Attention/ Calculation 0  Recall 3  Language- name 2 objects 0  Language- repeat 1  Language- follow 3 step command 3  Language- read & follow direction 0  Write a sentence 0  Copy design 0  Total score 20   PLEASE NOTE: A Mini-Cog screen was completed. Maximum score is 20. A value of 0 denotes this part of Folstein MMSE was not completed.  Orientation to Time - Max 5 Orientation to Place - Max 5  Registration - Max 3 Recall - Max 3 Language Repeat - Max 1 Language Follow 3 Step Command - Max 3  Immunization History  Administered Date(s) Administered  . Pneumococcal Conjugate-13 09/03/2014  . Pneumococcal Polysaccharide-23 09/07/2015  . Zoster 10/23/2014   Screening Tests Health Maintenance  Topic Date Due  . TETANUS/TDAP  08/27/2016 (Originally 01/12/1970)  . INFLUENZA VACCINE  12/28/2015  . COLONOSCOPY  05/03/2023  . ZOSTAVAX  Completed  . Hepatitis C Screening  Completed  . PNA vac Low Risk Adult  Completed      Plan:     I have personally reviewed and addressed the Medicare Annual Wellness questionnaire and have noted the following in the patient's chart:  A. Medical and social history B. Use of alcohol, tobacco or illicit drugs  C. Current medications  and supplements D. Functional ability and status E.  Nutritional status F.  Physical activity G. Advance directives H. List of other physicians I.  Hospitalizations, surgeries, and ER visits in previous 12 months J.  [place] - may include hearing, vision, cognitive, depression   L. Referrals and appointments - none  In addition, I have reviewed and discussed with patient certain preventive protocols, quality metrics, and best practice recommendations. A written personalized care plan for preventive services as well as general preventive health recommendations were provided to patient.  See attached scanned questionnaire for additional information.   Signed,   Lindell Noe, MHA, BS, LPN Health Advisor

## 2015-09-07 NOTE — Progress Notes (Signed)
Pre visit review using our clinic review tool, if applicable. No additional management support is needed unless otherwise documented below in the visit note. 

## 2015-09-16 ENCOUNTER — Ambulatory Visit (INDEPENDENT_AMBULATORY_CARE_PROVIDER_SITE_OTHER): Payer: Commercial Managed Care - HMO | Admitting: Family Medicine

## 2015-09-16 ENCOUNTER — Encounter: Payer: Self-pay | Admitting: Family Medicine

## 2015-09-16 VITALS — BP 108/52 | HR 63 | Temp 98.3°F | Ht 72.0 in | Wt 186.5 lb

## 2015-09-16 DIAGNOSIS — R972 Elevated prostate specific antigen [PSA]: Secondary | ICD-10-CM

## 2015-09-16 NOTE — Progress Notes (Signed)
Pre visit review using our clinic review tool, if applicable. No additional management support is needed unless otherwise documented below in the visit note.  Routine f/u.  Labs d/w pt.  Elevated PSA noted.  Minimal LUTS, ie occ weaker stream. Not an acute change.  PSA up from prev level.  Father with prostate cancer later in life, died in his late 17s.  D/w pt.   Other labs unremarkable.   Meds, vitals, and allergies reviewed.   ROS: See HPI.  Otherwise, noncontributory.  GEN: nad, alert and oriented HEENT: mucous membranes moist NECK: supple w/o LA CV: rrr.  no murmur PULM: ctab, no inc wob ABD: soft, +bs EXT: no edema SKIN: no acute rash Prostate gland firm and smooth, no enlargement, nodularity, tenderness, mass, asymmetry or induration.

## 2015-09-16 NOTE — Patient Instructions (Signed)
Marion will call about your referral. See her on the way out.  Take care.  Glad to see you.  

## 2015-09-17 ENCOUNTER — Encounter: Payer: Self-pay | Admitting: Family Medicine

## 2015-09-17 DIAGNOSIS — R972 Elevated prostate specific antigen [PSA]: Secondary | ICD-10-CM | POA: Insufficient documentation

## 2015-09-17 NOTE — Assessment & Plan Note (Signed)
New, d/w pt.  Given FH, would ask for uro input.  D/w pt about PSA in general and possible false positives, but in his case I would go ahead and refer.  He agrees.   I appreciate uro input.

## 2015-10-28 DIAGNOSIS — R972 Elevated prostate specific antigen [PSA]: Secondary | ICD-10-CM | POA: Diagnosis not present

## 2016-02-03 DIAGNOSIS — R972 Elevated prostate specific antigen [PSA]: Secondary | ICD-10-CM | POA: Diagnosis not present

## 2016-04-11 ENCOUNTER — Encounter: Payer: Self-pay | Admitting: Family Medicine

## 2016-04-28 DIAGNOSIS — R972 Elevated prostate specific antigen [PSA]: Secondary | ICD-10-CM | POA: Diagnosis not present

## 2016-05-09 DIAGNOSIS — R972 Elevated prostate specific antigen [PSA]: Secondary | ICD-10-CM | POA: Diagnosis not present

## 2016-05-15 DIAGNOSIS — H5201 Hypermetropia, right eye: Secondary | ICD-10-CM | POA: Diagnosis not present

## 2016-08-08 DIAGNOSIS — R972 Elevated prostate specific antigen [PSA]: Secondary | ICD-10-CM | POA: Diagnosis not present

## 2016-09-15 ENCOUNTER — Ambulatory Visit: Payer: Commercial Managed Care - HMO

## 2016-09-15 ENCOUNTER — Other Ambulatory Visit: Payer: Commercial Managed Care - HMO

## 2016-09-19 ENCOUNTER — Ambulatory Visit (INDEPENDENT_AMBULATORY_CARE_PROVIDER_SITE_OTHER): Payer: Medicare HMO

## 2016-09-19 ENCOUNTER — Encounter: Payer: Commercial Managed Care - HMO | Admitting: Family Medicine

## 2016-09-19 ENCOUNTER — Other Ambulatory Visit: Payer: Self-pay | Admitting: Family Medicine

## 2016-09-19 VITALS — BP 110/80 | HR 60 | Temp 98.0°F | Ht 71.5 in | Wt 187.5 lb

## 2016-09-19 DIAGNOSIS — E785 Hyperlipidemia, unspecified: Secondary | ICD-10-CM | POA: Diagnosis not present

## 2016-09-19 DIAGNOSIS — Z0000 Encounter for general adult medical examination without abnormal findings: Secondary | ICD-10-CM

## 2016-09-19 LAB — COMPREHENSIVE METABOLIC PANEL
ALT: 20 U/L (ref 0–53)
AST: 23 U/L (ref 0–37)
Albumin: 4.1 g/dL (ref 3.5–5.2)
Alkaline Phosphatase: 41 U/L (ref 39–117)
BUN: 19 mg/dL (ref 6–23)
CALCIUM: 9.7 mg/dL (ref 8.4–10.5)
CHLORIDE: 103 meq/L (ref 96–112)
CO2: 31 meq/L (ref 19–32)
Creatinine, Ser: 1.05 mg/dL (ref 0.40–1.50)
GFR: 90.42 mL/min (ref >60.00)
GLUCOSE: 95 mg/dL (ref 70–99)
Potassium: 4.3 mEq/L (ref 3.5–5.1)
Sodium: 138 mEq/L (ref 135–145)
Total Bilirubin: 0.4 mg/dL (ref 0.2–1.2)
Total Protein: 6.9 g/dL (ref 6.0–8.3)

## 2016-09-19 LAB — LIPID PANEL
CHOL/HDL RATIO: 3
Cholesterol: 198 mg/dL (ref 0–200)
HDL: 58.5 mg/dL (ref >39.00)
LDL CALC: 127 mg/dL — AB (ref 0–99)
NONHDL: 139.88
TRIGLYCERIDES: 66 mg/dL (ref 0.0–149.0)
VLDL: 13.2 mg/dL (ref 0.0–40.0)

## 2016-09-19 NOTE — Progress Notes (Signed)
Subjective:   Robert Perez is a 66 y.o. male who presents for Medicare Annual/Subsequent preventive examination.  Review of Systems:  N/A Cardiac Risk Factors include: advanced age (>15men, >55 women);male gender     Objective:    Vitals: BP 110/80 (BP Location: Left Arm, Patient Position: Sitting, Cuff Size: Normal)   Pulse 60   Temp 98 F (36.7 C) (Oral)   Ht 5' 11.5" (1.816 m) Comment: no shoes  Wt 187 lb 8 oz (85 kg)   SpO2 98%   BMI 25.79 kg/m   Body mass index is 25.79 kg/m.  Tobacco History  Smoking Status  . Never Smoker  Smokeless Tobacco  . Never Used     Counseling given: No   Past Medical History:  Diagnosis Date  . Allergic rhinitis due to pollen    Past Surgical History:  Procedure Laterality Date  . NO PAST SURGERIES     Family History  Problem Relation Age of Onset  . Diabetes Mother   . Arthritis Father   . Hyperlipidemia Father   . Hypertension Father   . Kidney disease Father   . Prostate cancer Father     dx'd late in life.   . Breast cancer Sister   . Colon cancer Maternal Grandfather   . Cancer Brother   . Bladder Cancer Brother   . Pancreatic cancer Neg Hx   . Stomach cancer Neg Hx    History  Sexual Activity  . Sexual activity: No    No outpatient encounter prescriptions on file as of 09/19/2016.   No facility-administered encounter medications on file as of 09/19/2016.     Activities of Daily Living In your present state of health, do you have any difficulty performing the following activities: 09/19/2016  Hearing? N  Vision? N  Difficulty concentrating or making decisions? N  Walking or climbing stairs? N  Dressing or bathing? N  Doing errands, shopping? N  Preparing Food and eating ? N  Using the Toilet? N  In the past six months, have you accidently leaked urine? N  Do you have problems with loss of bowel control? N  Managing your Medications? N  Managing your Finances? N  Housekeeping or managing your  Housekeeping? N  Some recent data might be hidden    Patient Care Team: Tonia Ghent, MD as PCP - General (Family Medicine)   Assessment:     Hearing Screening   125Hz  250Hz  500Hz  1000Hz  2000Hz  3000Hz  4000Hz  6000Hz  8000Hz   Right ear:   40 40 40  0    Left ear:   40 40 40  0    Vision Screening Comments: Last vision exam in Jan 2018 at [place] and Dietary recommendations Current Exercise Habits: Home exercise routine, Type of exercise: Other - see comments;strength training/weights;stretching;walking (bicycling), Time (Minutes): 60 (bicycling 90-120 min ), Frequency (Times/Week): 2, Weekly Exercise (Minutes/Week): 120, Intensity: Moderate, Exercise limited by: None identified  Goals    . Reduce portion size          Starting 09/19/2016, I will eat only 1 small bag of potato chips daily and to reduce intake of simple carbohydrates.      Fall Risk Fall Risk  09/19/2016 09/07/2015 09/03/2014  Falls in the past year? No No Yes  Number falls in past yr: - - 1  Injury with Fall? - - No   Depression Screen PHQ 2/9 Scores 09/19/2016 09/07/2015 09/03/2014  PHQ -  2 Score 0 0 0    Cognitive Function MMSE - Mini Mental State Exam 09/19/2016 09/07/2015  Orientation to time 5 5  Orientation to Place 5 5  Registration 3 3  Attention/ Calculation 0 0  Recall 3 3  Language- name 2 objects 0 0  Language- repeat 1 1  Language- follow 3 step command 3 3  Language- read & follow direction 0 0  Write a sentence 0 0  Copy design 0 0  Total score 20 20       PLEASE NOTE: A Mini-Cog screen was completed. Maximum score is 20. A value of 0 denotes this part of Folstein MMSE was not completed or the patient failed this part of the Mini-Cog screening.   Mini-Cog Screening Orientation to Time - Max 5 pts Orientation to Place - Max 5 pts Registration - Max 3 pts Recall - Max 3 pts Language Repeat - Max 1 pts Language Follow 3 Step Command - Max 3  pts   Immunization History  Administered Date(s) Administered  . Influenza-Unspecified 02/08/2016  . Pneumococcal Conjugate-13 09/03/2014  . Pneumococcal Polysaccharide-23 09/07/2015  . Zoster 10/23/2014   Screening Tests Health Maintenance  Topic Date Due  . TETANUS/TDAP  09/25/2017 (Originally 01/12/1970)  . INFLUENZA VACCINE  12/27/2016  . COLONOSCOPY  05/03/2023  . Hepatitis C Screening  Completed  . PNA vac Low Risk Adult  Completed      Plan:     I have personally reviewed and addressed the Medicare Annual Wellness questionnaire and have noted the following in the patient's chart:  A. Medical and social history B. Use of alcohol, tobacco or illicit drugs  C. Current medications and supplements D. Functional ability and status E.  Nutritional status F.  Physical activity G. Advance directives H. List of other physicians I.  Hospitalizations, surgeries, and ER visits in previous 12 months J.  [place] to include hearing, vision, cognitive, depression L. Referrals and appointments - none  In addition, I have reviewed and discussed with patient certain preventive protocols, quality metrics, and best practice recommendations. A written personalized care plan for preventive services as well as general preventive health recommendations were provided to patient.  See attached scanned questionnaire for additional information.   Signed,   Lindell Noe, MHA, BS, LPN Health Coach

## 2016-09-19 NOTE — Progress Notes (Signed)
Pre visit review using our clinic review tool, if applicable. No additional management support is needed unless otherwise documented below in the visit note. 

## 2016-09-19 NOTE — Patient Instructions (Signed)
Robert Perez , Thank you for taking time to come for your Medicare Wellness Visit. I appreciate your ongoing commitment to your health goals. Please review the following plan we discussed and let me know if I can assist you in the future.   These are the goals we discussed: Goals    . Reduce portion size          Starting 09/19/2016, I will eat only 1 small bag of potato chips daily and to reduce intake of simple carbohydrates.       This is a list of the screening recommended for you and due dates:  Health Maintenance  Topic Date Due  . Tetanus Vaccine  09/25/2017*  . Flu Shot  12/27/2016  . Colon Cancer Screening  05/03/2023  .  Hepatitis C: One time screening is recommended by Center for Disease Control  (CDC) for  adults born from [birth year] through 1967.   Completed  . Pneumonia vaccines  Completed  *Topic was postponed. The date shown is not the original due date.   Preventive Care for Adults  A healthy lifestyle and preventive care can promote health and wellness. Preventive health guidelines for adults include the following key practices.  . A routine yearly physical is a good way to check with your health care provider about your health and preventive screening. It is a chance to share any concerns and updates on your health and to receive a thorough exam.  . Visit your dentist for a routine exam and preventive care every 6 months. Brush your teeth twice a day and floss once a day. Good oral hygiene prevents tooth decay and gum disease.  . The frequency of eye exams is based on your age, health, family medical history, use  of contact lenses, and other factors. Follow your health care provider's ecommendations for frequency of eye exams.  . Eat a healthy diet. Foods like vegetables, fruits, whole grains, low-fat dairy products, and lean protein foods contain the nutrients you need without too many calories. Decrease your intake of foods high in solid fats, added sugars, and salt. Eat  the right amount of calories for you. Get information about a proper diet from your health care provider, if necessary.  . Regular physical exercise is one of the most important things you can do for your health. Most adults should get at least 150 minutes of moderate-intensity exercise (any activity that increases your heart rate and causes you to sweat) each week. In addition, most adults need muscle-strengthening exercises on 2 or more days a week.  Silver Sneakers may be a benefit available to you. To determine eligibility, you may visit the website: www.silversneakers.com or contact program at 215-454-6605 Mon-Fri between 8AM-8PM.   . Maintain a healthy weight. The body mass index (BMI) is a screening tool to identify possible weight problems. It provides an estimate of body fat based on height and weight. Your health care provider can find your BMI and can help you achieve or maintain a healthy weight.   For adults 20 years and older: ? A BMI below 18.5 is considered underweight. ? A BMI of 18.5 to 24.9 is normal. ? A BMI of 25 to 29.9 is considered overweight. ? A BMI of 30 and above is considered obese.   . Maintain normal blood lipids and cholesterol levels by exercising and minimizing your intake of saturated fat. Eat a balanced diet with plenty of fruit and vegetables. Blood tests for lipids and cholesterol should begin  at age 59 and be repeated every 5 years. If your lipid or cholesterol levels are high, you are over 50, or you are at high risk for heart disease, you may need your cholesterol levels checked more frequently. Ongoing high lipid and cholesterol levels should be treated with medicines if diet and exercise are not working.  . If you smoke, find out from your health care provider how to quit. If you do not use tobacco, please do not start.  . If you choose to drink alcohol, please do not consume more than 2 drinks per day. One drink is considered to be 12 ounces (355 mL)  of beer, 5 ounces (148 mL) of wine, or 1.5 ounces (44 mL) of liquor.  . If you are 65-7 years old, ask your health care provider if you should take aspirin to prevent strokes.  . Use sunscreen. Apply sunscreen liberally and repeatedly throughout the day. You should seek shade when your shadow is shorter than you. Protect yourself by wearing long sleeves, pants, a wide-brimmed hat, and sunglasses year round, whenever you are outdoors.  . Once a month, do a whole body skin exam, using a mirror to look at the skin on your back. Tell your health care provider of new moles, moles that have irregular borders, moles that are larger than a pencil eraser, or moles that have changed in shape or color.

## 2016-09-19 NOTE — Progress Notes (Signed)
PCP notes:   Health maintenance:  Tetanus - addressed; pt plans to get vaccine at employer  Abnormal screenings:   Hearing - failed  Patient concerns:   Pt reports onset of intermittent lower back pain. Pt states he feels it is arthritis.   Nurse concerns:  None  Next PCP appt:   09/26/16 @ 1400  I reviewed health advisor's note, was available for consultation on the day of service listed in this note, and agree with documentation and plan. Elsie Stain, MD.

## 2016-09-26 ENCOUNTER — Encounter: Payer: Self-pay | Admitting: Family Medicine

## 2016-09-26 ENCOUNTER — Ambulatory Visit (INDEPENDENT_AMBULATORY_CARE_PROVIDER_SITE_OTHER): Payer: Medicare HMO | Admitting: Family Medicine

## 2016-09-26 VITALS — BP 126/70 | HR 83 | Temp 98.2°F | Ht 72.0 in | Wt 189.8 lb

## 2016-09-26 DIAGNOSIS — Z7189 Other specified counseling: Secondary | ICD-10-CM

## 2016-09-26 DIAGNOSIS — R972 Elevated prostate specific antigen [PSA]: Secondary | ICD-10-CM

## 2016-09-26 DIAGNOSIS — M545 Low back pain, unspecified: Secondary | ICD-10-CM

## 2016-09-26 DIAGNOSIS — Z0000 Encounter for general adult medical examination without abnormal findings: Secondary | ICD-10-CM

## 2016-09-26 NOTE — Progress Notes (Signed)
Pre visit review using our clinic review tool, if applicable. No additional management support is needed unless otherwise documented below in the visit note. 

## 2016-09-26 NOTE — Patient Instructions (Signed)
You may need to rotate your mattress.  Try stretching in the AM for your lower back.  I'll await the urology notes.  Take care.  Glad to see you.  Update me as needed.

## 2016-09-26 NOTE — Progress Notes (Signed)
Tetanus - addressed; pt plans to get vaccine at employer.  I'll defer, d/w pt.    Abnormal screenings:  Hearing - failed.  Declined hearing aids.    Pt reports onset of intermittent lower back pain. Pt states he feels it is arthritis. Noted after kneeling down.  He is careful about lifting.  No leg sx.  No bruising.  Just above the belt, B lower back and midline.  Sx clearly better when up and moving.  It doesn't bother him when on the bike.  No FCNAVD.  Hasn't tried ice or heat, hasn't bothered him enough to try those.   ================================================== He has seen urology about his PSA and has f/u pending.  He had some urinary sx after last PNA shot- unclear if that was related.  The urinary sx resolved in the meantime.    Advance directive- his daughter would be designated if patient were incapacitated.  Colonoscopy 2014 Diet and exercise d/w pt.  He has been biking a lot.  Some jogging.    PMH and SH reviewed  ROS: Per HPI unless specifically indicated in ROS section   Meds, vitals, and allergies reviewed.   GEN: nad, alert and oriented HEENT: mucous membranes moist NECK: supple w/o LA CV: rrr.  no murmur PULM: ctab, no inc wob ABD: soft, +bs EXT: no edema SKIN: no acute rash Back not tender to palpation in the midline. No CVA pain. Normal strength and sensation in the bilateral lower extremities

## 2016-09-27 DIAGNOSIS — M549 Dorsalgia, unspecified: Secondary | ICD-10-CM | POA: Insufficient documentation

## 2016-09-27 DIAGNOSIS — Z0000 Encounter for general adult medical examination without abnormal findings: Secondary | ICD-10-CM | POA: Insufficient documentation

## 2016-09-27 NOTE — Assessment & Plan Note (Signed)
Advance directive- his daughter would be designated if patient were incapacitated.

## 2016-09-27 NOTE — Assessment & Plan Note (Signed)
Advance directive- his daughter would be designated if patient were incapacitated.  Colonoscopy 2014 Diet and exercise d/w pt.  He has been biking a lot.  Some jogging.   Tetanus - addressed; pt plans to get vaccine at employer.  I'll defer, d/w pt.   Hearing - failed.  Declined hearing aids.

## 2016-09-27 NOTE — Assessment & Plan Note (Signed)
Discussed with patient about the issues with PSA testing, potentially involving false positive results. Urology is monitoring. He has not yet required a biopsy. I appreciate the help of all involved. >25 minutes spent in face to face time with patient, >50% spent in counselling or coordination of care.

## 2016-09-27 NOTE — Assessment & Plan Note (Signed)
Without flag symptoms. Noted in the a.m. when he is getting out of bed. Better as the day goes on. He may need to rotate his mattress. Discussed with patient about stretching. Normal exam today. Update me as needed.

## 2016-10-27 ENCOUNTER — Encounter: Payer: Self-pay | Admitting: Medical

## 2016-10-27 ENCOUNTER — Ambulatory Visit: Payer: Self-pay | Admitting: Medical

## 2016-10-27 VITALS — BP 135/70 | HR 97 | Temp 97.8°F | Resp 16 | Ht 73.0 in | Wt 185.0 lb

## 2016-10-27 DIAGNOSIS — R05 Cough: Secondary | ICD-10-CM

## 2016-10-27 DIAGNOSIS — J069 Acute upper respiratory infection, unspecified: Secondary | ICD-10-CM

## 2016-10-27 DIAGNOSIS — R059 Cough, unspecified: Secondary | ICD-10-CM

## 2016-10-27 MED ORDER — ALBUTEROL SULFATE HFA 108 (90 BASE) MCG/ACT IN AERS
2.0000 | INHALATION_SPRAY | Freq: Four times a day (QID) | RESPIRATORY_TRACT | 0 refills | Status: DC | PRN
Start: 2016-10-27 — End: 2017-03-09

## 2016-10-27 MED ORDER — AZITHROMYCIN 250 MG PO TABS: ORAL_TABLET | ORAL | 0 refills | Status: DC

## 2016-10-27 NOTE — Patient Instructions (Signed)
Rest , increase fluids. Take medication as prescribed. Return to in  3-5 days if not improving. Sooner if needed.

## 2016-10-27 NOTE — Progress Notes (Signed)
   Subjective:    Patient ID: [name], male    DOB: 10/05/1950, 66 y.o.   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 Took yesterday off because he was unable to sleep from the cough on Wednesday night.  No fever, some shortness of breath with the cough.  Deep breaths increase the cough. Drinking more fluids. Usually bicycles  15-20 [place] per week , has not done so because of not feeling well.   Review of Systems  Constitutional: Positive for fatigue. Negative for chills and fever.  HENT: Negative for congestion, ear pain and sore throat.   Eyes: Negative for discharge and itching.  Respiratory: Positive for cough, shortness of breath and wheezing. Negative for chest tightness.   Cardiovascular: Negative for chest pain.  Gastrointestinal: Negative for diarrhea, nausea and vomiting.  Endocrine: Positive for polyuria. Negative for polydipsia and polyphagia.  Genitourinary: Negative for hematuria.  Skin: Negative for rash.  Allergic/Immunologic: Positive for environmental allergies. Negative for food allergies.  Neurological: Negative for dizziness and speech difficulty.  Hematological: Negative for adenopathy.  Psychiatric/Behavioral: Negative for confusion and hallucinations.   Wheezing on Thursday morning.  Chest pain  right sided after moving a storage tub up into the attic on Saturday. Resolved  In 2 days. None now. Says he has been urinating more since taking in more fluids. Objective:   Physical Exam  Constitutional: He is oriented to person, place, and time. He appears well-developed and well-nourished.  HENT:  Head: Normocephalic and atraumatic.  Right Ear: Hearing, tympanic membrane, external ear and ear canal normal.  Left Ear: Hearing, tympanic membrane, external ear and ear canal normal.  Nose: Nose normal.  Mouth/Throat: Uvula is midline and oropharynx is  clear and moist.  Eyes: Conjunctivae and EOM are normal. Pupils are equal, round, and reactive to light.  Neck: Normal range of motion. Neck supple.  Cardiovascular: Normal rate, regular rhythm and normal heart sounds.   Pulmonary/Chest: Effort normal and breath sounds normal.  Musculoskeletal: Normal range of motion.  Lymphadenopathy:    He has no cervical adenopathy.  Neurological: He is alert and oriented to person, place, and time.  Skin: Skin is warm and dry.  Psychiatric: He has a normal mood and affect. His behavior is normal.  Nursing note and vitals reviewed.  Mild cough on deep inspiration.       Assessment & Plan:  Upper respiratory infection  E-prescribed Z-pak 250 mg take 2 tablets by mouth day 1 then one tablet by mouth days 2-5, take with food,  Proair  2 puff every 6 hours as needed for cough , sob or wheezing. Rest , increase fluids. May continue taking  Mucinex DM take as directed for cough. Return to the clinic in  3-5 days if not improving sooner if worsening.

## 2016-11-07 DIAGNOSIS — R972 Elevated prostate specific antigen [PSA]: Secondary | ICD-10-CM | POA: Diagnosis not present

## 2016-11-14 DIAGNOSIS — R972 Elevated prostate specific antigen [PSA]: Secondary | ICD-10-CM | POA: Diagnosis not present

## 2016-11-14 DIAGNOSIS — N400 Enlarged prostate without lower urinary tract symptoms: Secondary | ICD-10-CM | POA: Diagnosis not present

## 2016-12-05 DIAGNOSIS — C61 Malignant neoplasm of prostate: Secondary | ICD-10-CM | POA: Diagnosis not present

## 2016-12-05 DIAGNOSIS — N4232 Atypical small acinar proliferation of prostate: Secondary | ICD-10-CM | POA: Diagnosis not present

## 2016-12-05 DIAGNOSIS — R972 Elevated prostate specific antigen [PSA]: Secondary | ICD-10-CM | POA: Diagnosis not present

## 2016-12-12 DIAGNOSIS — C61 Malignant neoplasm of prostate: Secondary | ICD-10-CM | POA: Diagnosis not present

## 2016-12-26 DIAGNOSIS — C61 Malignant neoplasm of prostate: Secondary | ICD-10-CM | POA: Diagnosis not present

## 2016-12-27 ENCOUNTER — Encounter: Payer: Self-pay | Admitting: Radiation Oncology

## 2017-01-08 ENCOUNTER — Encounter: Payer: Self-pay | Admitting: Medical Oncology

## 2017-01-08 ENCOUNTER — Encounter: Payer: Self-pay | Admitting: Radiation Oncology

## 2017-01-08 ENCOUNTER — Ambulatory Visit
Admission: RE | Admit: 2017-01-08 | Discharge: 2017-01-08 | Disposition: A | Discharge status: Home / Self Care | Payer: Medicare HMO | Source: Ambulatory Visit | Attending: Radiation Oncology | Admitting: Radiation Oncology

## 2017-01-08 VITALS — BP 143/83 | HR 59 | Temp 98.0°F | Resp 16 | Ht 73.0 in | Wt 189.0 lb

## 2017-01-08 DIAGNOSIS — C61 Malignant neoplasm of prostate: Secondary | ICD-10-CM | POA: Diagnosis not present

## 2017-01-08 DIAGNOSIS — Z510 Encounter for antineoplastic radiation therapy: Secondary | ICD-10-CM | POA: Diagnosis not present

## 2017-01-08 DIAGNOSIS — Z808 Family history of malignant neoplasm of other organs or systems: Secondary | ICD-10-CM | POA: Diagnosis not present

## 2017-01-08 DIAGNOSIS — R972 Elevated prostate specific antigen [PSA]: Secondary | ICD-10-CM | POA: Diagnosis not present

## 2017-01-08 DIAGNOSIS — Z809 Family history of malignant neoplasm, unspecified: Secondary | ICD-10-CM | POA: Insufficient documentation

## 2017-01-08 HISTORY — DX: Malignant neoplasm of prostate: C61

## 2017-01-08 NOTE — Progress Notes (Signed)
GU Location of Tumor / Histology: prostatic adenocarcinoma  If Prostate Cancer, Gleason Score is (3 + 4) and PSA is (7.78) as of 12/05/16. Prostate volume: 33 cc.  [name] was referred by Dr. Renford Dills to Dr. Kathie Rhodes for further evaluation of an elevated PSA.   Biopsies of prostate (if applicable) revealed:    Past/Anticipated interventions by urology, if any: Biopsy, referral to Dr. Tammi Klippel to discuss radioactive seed implant  Past/Anticipated interventions by medical oncology, if any: no  Weight changes, if any: no  Bowel/Bladder complaints, if any: IPSS 7. Denies dysuria, hematuria, or leakage.   Nausea/Vomiting, if any: no  Pain issues, if any:  Denies bony pain.  SAFETY ISSUES:  Prior radiation? no  Pacemaker/ICD? no  Possible current pregnancy? no  Is the patient on methotrexate? no  Current Complaints / other details:  66 year old male. Single. Retired. Breast, bladder and prostate cancer run in the family. Avid cyclist biking.

## 2017-01-08 NOTE — Progress Notes (Signed)
See progress note under physician encounter. 

## 2017-01-08 NOTE — Progress Notes (Signed)
Radiation Oncology         [phone] ________________________________  Initial outpatient Consultation  Name: Robert Perez MRN: [id number]  Date: 01/08/2017  DOB: 1950/08/01  WE:[id number], Elveria Rising, MD  Kathie Rhodes, MD   REFERRING PHYSICIAN: Kathie Rhodes, MD  DIAGNOSIS: 66 y.o.gentleman with Stage T1c adenocarcinoma of the prostate with Gleason Score of 3 + 4, and PSA of 7.78    ICD-10-CM   1. Malignant neoplasm of prostate ([place]) [place] Ambulatory Referral to Genetics  2. Family history of cancer Z80.9 Ambulatory Referral to Genetics    HISTORY OF PRESENT ILLNESS: Robert Perez is a 66 y.o. male with a diagnosis of prostate cancer. He was noted to have an elevated PSA of 7.78 by his primary care physician, Dr. Damita Dunnings.  Accordingly, he was referred for evaluation in urology by Dr. Damita Dunnings to Dr. Karsten Ro. Of note the patient has had a history of PSA dating back to 04/17 in the 6.12 range. On  11/14/16, digital rectal examination was performed revealing no nodule. The patient proceeded to transrectal ultrasound with 12 biopsies of the prostate on 12/05/2016.The prostate volume measured 33 cc. Out of 12 core biopsies, 2 were positive.  The maximum Gleason score was 3+4, and this was seen in the right mid gland laterally.  The patient reviewed the biopsy results with his urologist and he has kindly been referred today for discussion of potential radiation treatment options.   PREVIOUS RADIATION THERAPY: No  PAST MEDICAL HISTORY:  Past Medical History:  Diagnosis Date  . Allergic rhinitis due to pollen   . Prostate cancer ([place])   . PSA elevation       PAST SURGICAL HISTORY: Past Surgical History:  Procedure Laterality Date  . PROSTATE BIOPSY      FAMILY HISTORY:  Family History  Problem Relation Age of Onset  . Diabetes Mother   . Arthritis Father   . Hyperlipidemia Father   . Hypertension Father   . Kidney disease Father   . Prostate cancer Father        dx'd late in life.    . Breast cancer Sister   . Colon cancer Maternal Grandfather   . Cancer Brother   . Bladder Cancer Brother   . Breast cancer Sister   . Breast cancer Sister   . Pancreatic cancer Neg Hx   . Stomach cancer Neg Hx     SOCIAL HISTORY:  Social History   Social History  . Marital status: Single    Spouse name: N/A  . Number of children: N/A  . Years of education: N/A   Occupational History  . Not on file.   Social History Main Topics  . Smoking status: Never Smoker  . Smokeless tobacco: Never Used  . Alcohol use No  . Drug use: No  . Sexual activity: No   Other Topics Concern  . Not on file   Social History Narrative   Lifelong single.   Has one adult daughter.  Daughter is working in [place] for [employer].   [place] since 2004. 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 albuterol (PROVENTIL HFA;VENTOLIN HFA) 108 (90 Base) MCG/ACT inhaler Inhale 2 puffs into the lungs every 6 (six) hours as needed for wheezing or shortness of breath. Or cough (Patient  not taking: Reported on 01/08/2017) 1 Inhaler 0   No current facility-administered medications for this encounter.     REVIEW OF SYSTEMS:  On review of systems, the patient reports that he is doing well overall. He denies any chest pain, shortness of breath, cough, fevers, chills, night sweats, unintended weight changes. He denies any bowel disturbances, and denies abdominal pain, nausea or vomiting. He denies any new musculoskeletal or joint aches or pains. His IPSS was 4, indicating mild urinary symptoms of a weak stream and occasional frequency. He is not sexually active but able to achieve erections. A complete review of systems is obtained and is otherwise negative.    PHYSICAL EXAM:  Wt Readings from Last 3 Encounters:    01/08/17 189 lb (85.7 kg)  10/27/16 185 lb (83.9 kg)  09/26/16 189 lb 12 oz (86.1 kg)   Temp Readings from Last 3 Encounters:  01/08/17 98 F (36.7 C) (Oral)  10/27/16 97.8 F (36.6 C)  09/26/16 98.2 F (36.8 C) (Oral)   BP Readings from Last 3 Encounters:  01/08/17 (!) 143/83  10/27/16 135/70  09/26/16 126/70   Pulse Readings from Last 3 Encounters:  01/08/17 (!) 59  10/27/16 97  09/26/16 83   Pain Assessment Pain Score: 0-No pain/10  In general this is a well appearing African American gentleman in no acute distress. He is alert and oriented x4 and appropriate throughout the examination. HEENT reveals that the patient is normocephalic, atraumatic. EOMs are intact. PERRLA. Skin is intact without any evidence of gross lesions. Cardiovascular exam reveals a regular rate and rhythm, no clicks rubs or murmurs are auscultated. Chest is clear to auscultation bilaterally. Lymphatic assessment is performed and does not reveal any adenopathy in the cervical, supraclavicular, axillary, or inguinal chains. Abdomen has active bowel sounds in all quadrants and is intact. The abdomen is soft, non tender, non distended. Lower extremities are negative for pretibial pitting edema, deep calf tenderness, cyanosis or clubbing.   KPS = 100 100 - Normal; no complaints; no evidence of disease. 90   - Able to carry on normal activity; minor signs or symptoms of disease. 80   - Normal activity with effort; some signs or symptoms of disease. 83   - Cares for self; unable to carry on normal activity or to do active work. 60   - Requires occasional assistance, but is able to care for most of his personal needs. 50   - Requires considerable assistance and frequent medical care. 85   - Disabled; requires special care and assistance. 82   - Severely disabled; hospital admission is indicated although death not imminent. 33   - Very sick; hospital admission necessary; active supportive treatment  necessary. 10   - Moribund; fatal processes progressing rapidly. 0     - Dead  Karnofsky DA, Abelmann [place], Craver LS and [place] J[facility] [phone]) The use of the nitrogen mustards in the palliative treatment of carcinoma: with particular reference to bronchogenic carcinoma Cancer 1 634-56  LABORATORY DATA:  Lab Results  Component Value Date   WBC 10.3 01/07/2015   HGB 15.6 01/07/2015   HCT 46.8 01/07/2015   MCV 89.1 01/07/2015   PLT 266.0 01/07/2015   Lab Results  Component Value Date   NA 138 09/19/2016   K 4.3 09/19/2016   CL 103 09/19/2016   CO2 31 09/19/2016   Lab Results  Component Value Date   ALT 20 09/19/2016   AST 23 09/19/2016   ALKPHOS 41 09/19/2016  BILITOT 0.4 09/19/2016     RADIOGRAPHY: No results found.     IMPRESSION/PLAN: 1. 66 y.o.gentleman with intermediate risk Stage T1c adenocarcinoma of the prostate with Gleason Score of 3 + 4, and PSA of 7.78. We reviewed the findings and workup thus far. We discussed the natural history of prostate cancer. We reviewed the the implications of T-stage, Gleason's Score, and PSA on decision-making and outcomes in prostate cancer.  We discussed radiation treatment in the management of prostate cancer with regard to the logistics and delivery of external beam radiation treatment as well as the logistics and delivery of prostate brachytherapy.  We compared and contrasted each of these approaches and also compared these against prostatectomy. We discussed the risks and benefits of seed implant. Patient is not interested in surgery and expressed interest in prostate brachytherapy. We will share this with Dr. Karsten Ro and move forward with scheduling the procedure in the near future.    2. Possible genetic predisposition to cancer. The patient's family history was reviewed and is suspicious for possible genetic predisposition to malignancy. We will refer him to genetic counseling. He is in agreement.     Carola Rhine,  PAC and   Tyler Pita, MD [facility] Medical Director and Director of Stereotactic Radiosurgery Direct Dial: [phone]  Fax: [phone] [email or web address]  Skype  [email or web address] This document serves as a record of services personally performed by Shona Simpson, PA-C and Tyler Pita, MD. It was created on their behalf by Valeta Harms, a trained medical scribe. The creation of this record is based on the scribe's personal observations and the providers' statements to them. This document has been checked and approved by the attending provider.

## 2017-01-15 ENCOUNTER — Other Ambulatory Visit: Payer: Self-pay | Admitting: Urology

## 2017-01-15 ENCOUNTER — Telehealth: Payer: Self-pay | Admitting: *Deleted

## 2017-01-15 NOTE — Telephone Encounter (Signed)
CALLED PATIENT TO INFORM OF HIS PRE-SEED PLANNING CT FOR 02-02-17 AND HIS IMPLANT ON 03-16-17, SPOKE WITH PATIENT AND HE IS AWARE OF THESE APPTS.

## 2017-01-29 NOTE — Progress Notes (Signed)
  Radiation Oncology         [phone]) [phone] ________________________________  Name: Robert Perez MRN: [id number]  Date: 02/02/2017  DOB: [birth year]  SIMULATION AND TREATMENT PLANNING NOTE PUBIC ARCH STUDY  MG:NOIBBC, Elveria Rising, MD  Tonia Ghent, MD  DIAGNOSIS: 66 y.o.gentleman with Stage T1c adenocarcinoma of the prostate with Gleason Score of 3 + 4, and PSA of 7.78     ICD-10-CM   1. Malignant neoplasm of prostate ([place]) C61     COMPLEX SIMULATION:  The patient presented today for evaluation for possible prostate seed implant. He was brought to the radiation planning suite and placed supine on the CT couch. A 3-dimensional image study set was obtained in upload to the planning computer. There, on each axial slice, I contoured the prostate gland. Then, using three-dimensional radiation planning tools I reconstructed the prostate in view of the structures from the transperineal needle pathway to assess for possible pubic arch interference. In doing so, I did not appreciate any pubic arch interference. Also, the patient's prostate volume was estimated based on the drawn structure. The volume was 38 cc.  Given the pubic arch appearance and prostate volume, patient remains a good candidate to proceed with prostate seed implant. Today, he freely provided informed written consent to proceed.    PLAN: The patient will undergo prostate seed implant.   ________________________________  Sheral Apley. Tammi Klippel, M.D.

## 2017-01-31 ENCOUNTER — Other Ambulatory Visit: Payer: Self-pay | Admitting: Urology

## 2017-02-02 ENCOUNTER — Ambulatory Visit (HOSPITAL_COMMUNITY)
Admission: RE | Admit: 2017-02-02 | Discharge: 2017-02-02 | Disposition: A | Discharge status: Home / Self Care | Payer: Medicare HMO | Source: Ambulatory Visit | Attending: Urology | Admitting: Urology

## 2017-02-02 ENCOUNTER — Ambulatory Visit
Admission: RE | Admit: 2017-02-02 | Discharge: 2017-02-02 | Disposition: A | Discharge status: Home / Self Care | Payer: Medicare HMO | Source: Ambulatory Visit | Attending: Radiation Oncology | Admitting: Radiation Oncology

## 2017-02-02 ENCOUNTER — Encounter: Payer: Self-pay | Admitting: Medical Oncology

## 2017-02-02 DIAGNOSIS — J984 Other disorders of lung: Secondary | ICD-10-CM | POA: Insufficient documentation

## 2017-02-02 DIAGNOSIS — Z01818 Encounter for other preprocedural examination: Secondary | ICD-10-CM | POA: Insufficient documentation

## 2017-02-02 DIAGNOSIS — C61 Malignant neoplasm of prostate: Secondary | ICD-10-CM | POA: Diagnosis not present

## 2017-02-02 DIAGNOSIS — Z510 Encounter for antineoplastic radiation therapy: Secondary | ICD-10-CM | POA: Diagnosis not present

## 2017-02-02 DIAGNOSIS — Z809 Family history of malignant neoplasm, unspecified: Secondary | ICD-10-CM | POA: Diagnosis not present

## 2017-02-27 DIAGNOSIS — C61 Malignant neoplasm of prostate: Secondary | ICD-10-CM | POA: Diagnosis not present

## 2017-02-27 DIAGNOSIS — R972 Elevated prostate specific antigen [PSA]: Secondary | ICD-10-CM | POA: Diagnosis not present

## 2017-03-08 ENCOUNTER — Telehealth: Payer: Self-pay | Admitting: *Deleted

## 2017-03-08 NOTE — Telephone Encounter (Signed)
Called patient to remind of labs for 03-09-17 for implant on 03-16-17, spoke with patient and he is aware of this appt.

## 2017-03-09 ENCOUNTER — Encounter (HOSPITAL_BASED_OUTPATIENT_CLINIC_OR_DEPARTMENT_OTHER): Payer: Self-pay | Admitting: *Deleted

## 2017-03-09 DIAGNOSIS — Z8042 Family history of malignant neoplasm of prostate: Secondary | ICD-10-CM | POA: Diagnosis not present

## 2017-03-09 DIAGNOSIS — Z803 Family history of malignant neoplasm of breast: Secondary | ICD-10-CM | POA: Diagnosis not present

## 2017-03-09 DIAGNOSIS — C61 Malignant neoplasm of prostate: Secondary | ICD-10-CM | POA: Diagnosis not present

## 2017-03-09 DIAGNOSIS — Z8052 Family history of malignant neoplasm of bladder: Secondary | ICD-10-CM | POA: Diagnosis not present

## 2017-03-09 LAB — CBC
HCT: 47.1 % (ref 39.0–52.0)
Hemoglobin: 16.1 g/dL (ref 13.0–17.0)
MCH: 30.1 pg (ref 26.0–34.0)
MCHC: 34.2 g/dL (ref 30.0–36.0)
MCV: 88 fL (ref 78.0–100.0)
Platelets: 253 10*3/uL (ref 150–400)
RBC: 5.35 MIL/uL (ref 4.22–5.81)
RDW: 14.1 % (ref 11.5–15.5)
WBC: 5.3 10*3/uL (ref 4.0–10.5)

## 2017-03-09 LAB — COMPREHENSIVE METABOLIC PANEL
ALT: 17 U/L (ref 17–63)
AST: 19 U/L (ref 15–41)
Albumin: 4.5 g/dL (ref 3.5–5.0)
Alkaline Phosphatase: 51 U/L (ref 38–126)
Anion gap: 5 (ref 5–15)
BUN: 19 mg/dL (ref 6–20)
CO2: 30 mmol/L (ref 22–32)
Calcium: 9.6 mg/dL (ref 8.9–10.3)
Chloride: 103 mmol/L (ref 101–111)
Creatinine, Ser: 1.04 mg/dL (ref 0.61–1.24)
GFR calc Af Amer: >60 mL/min (ref >60)
GFR calc non Af Amer: >60 mL/min (ref >60)
Glucose, Bld: 102 mg/dL — ABNORMAL HIGH (ref 65–99)
Potassium: 4.2 mmol/L (ref 3.5–5.1)
Sodium: 138 mmol/L (ref 135–145)
Total Bilirubin: 1 mg/dL (ref 0.3–1.2)
Total Protein: 7.6 g/dL (ref 6.5–8.1)

## 2017-03-09 LAB — APTT: aPTT: 33 seconds (ref 24–36)

## 2017-03-09 LAB — PROTIME-INR
INR: 0.99
Prothrombin Time: 13 seconds (ref 11.4–15.2)

## 2017-03-09 NOTE — Progress Notes (Signed)
NPO AFTER MN.  ARRIVE AT 0730.  CURRENT LAB RESULTS, CXR, AND EKG IN CHART AND EPIC.  WILL DO FLEET ENEMA  AM DOS.

## 2017-03-14 DIAGNOSIS — C61 Malignant neoplasm of prostate: Secondary | ICD-10-CM | POA: Diagnosis not present

## 2017-03-14 NOTE — H&P (Signed)
CC: I have been diagnosed with prostate cancer.    HPI: Robert Perez is a 66 year-old male with newly diagnosed prostate cancer.    Elevated PSA: He was found to have a PSA of 6.12 in 4/17. His father was diagnosed with prostate cancer in his late 4s. He is an avid cyclist biking about [address] 3-4 times per week and after avoiding cycling and intercourse a repeat PSA was found to have decreased but still remains slightly elevated.    Interval history 12/05/16: He presents today for TRUS/BX do an elevated PSA of 7.78 and a positive family history of prostate cancer. No abnormality was noted on DRE.   Interval history 05/09/16: He reports that he has no new voiding symptoms. He denies any bone pain or unexplained weight loss. He did abstain from intercourse and cycling before his last PSA was drawn.   Interval history 11/14/16: He has been doing well urologically. He just got over bronchitis and has not been cycling. He is also obtained from intercourse prior to having his PSA drawn.   Interval history 12/05/16: He returns today for TRUS/BX due to an elevated PSA of 7.78   Interval history 12/12/16: Elevated PSA of 7.78 with no abnormality noted on DRE but a family history of prostate cancer.  TRUS/BX on 12/05/16: Prostate volume - 33 cc.  Pathology: Adenocarcinoma in 2/12 cores. Gleason score 3+4 = 7 in 50% of one core and 3+3 = 6 in 10% of one core both from the periphery of the right lobe.   Interval history 12/26/16: He returns today to discuss the options for treatment and decide on a treatment plan.   His prostate cancer was diagnosed 12/05/2016. His PSA at his time of diagnosis was 7.78. His cancer was T1c, Gleason score 3+4 = 7 in 50% of one core and 3+3 = 6 in 10% of one core both from the periphery of the right lobe..     ALLERGIES: All Codeine/Pain Meds - Nausea    MEDICATIONS: No Reported Medications     GU PSH: Prostate Needle Biopsy - 12/05/2016      PSH Notes: No Surgical  Problems   NON-GU PSH: Colonoscopy Surgical Pathology, Gross And Microscopic Examination For Prostate Needle - 12/05/2016    GU PMH: Prostate Cancer (Stable), He would like to proceed with radioactive seed implantation and we therefore discussed this in much further detail. He will be scheduled for an appointment with Dr. Tammi Klippel. - 12/26/2016, I went over his pathology report with him today as well as the significance of his Gleason score, number and location of cores positive and percent of cores positive. We then discussed his Partin table results in detail and the significance of these predictions as far as prognosis and need for further workup are concerned. I then discussed with him the various options available including active surveillance and treatment for cure such as radiation and surgery. We briefly discussed the forms of radiation available. I also gave him written information outlining the disease, its workup and the options for treatment for him to review further. , - 12/12/2016 Elevated PSA (Stable), I have discussed with the patient the possibility of blood per rectum, per urethra and in the ejaculate. He was counseled to contact me if he has any difficulties following his prostate biopsy whatsoever. - 12/05/2016, (Worsening), His prostate is smooth and benign but his PSA at 7.78 is higher than it has ever been., - 11/14/2016 (Worsening), Although his prostate remains benign his  PSA has continued to remain elevated and has actually increased slightly since a low of 4.22 in 6/17. It is currently 5.15., - 05/09/2016, Elevated PSA, - 10/28/2015 BPH w/o LUTS (Stable), He has some enlargement of his prostate noted on examination but has no voiding symptoms. - 11/14/2016    NON-GU PMH: Encounter for general adult medical examination without abnormal findings, Encounter for preventive health examination - 10/28/2015    FAMILY HISTORY: malignant neoplasm of breast - Runs In Family malignant neoplasm of  urinary bladder - Runs In Family Prostate Cancer - Runs In Family renal failure - Runs In Family    Notes: DM in mother   SOCIAL HISTORY: Marital Status: Single Preferred Language: English; Ethnicity: Not Hispanic Or Latino; Race: Black or African American Current Smoking Status: Patient has never smoked.   Tobacco Use Assessment Completed: Used Tobacco in last 30 days? Does not use smokeless tobacco. Has never drank.  Does not use drugs. Drinks 1 caffeinated drink per day. Has not had a blood transfusion.     Notes: Single, 1 daughter, Retired, working part time at [employer] in [employer]. Never a smoker   REVIEW OF SYSTEMS:    GU Review Male:   Patient reports burning/ pain with urination. Patient denies frequent urination, hard to postpone urination, get up at night to urinate, leakage of urine, stream starts and stops, trouble starting your stream, have to strain to urinate , erection problems, and penile pain.  Gastrointestinal (Upper):   Patient denies nausea, vomiting, and indigestion/ heartburn.  Gastrointestinal (Lower):   Patient denies diarrhea and constipation.  Constitutional:   Patient denies fever, night sweats, weight loss, and fatigue.  Skin:   Patient reports itching. Patient denies skin rash/ lesion.  Eyes:   Patient denies blurred vision and double vision.  Ears/ Nose/ Throat:   Patient reports sinus problems. Patient denies sore throat.  Hematologic/Lymphatic:   Patient denies swollen glands and easy bruising.  Cardiovascular:   Patient denies leg swelling and chest pains.  Respiratory:   Patient denies cough and shortness of breath.  Endocrine:   Patient denies excessive thirst.  Musculoskeletal:   Patient denies back pain and joint pain.  Neurological:   Patient denies headaches and dizziness.  Psychologic:   Patient denies depression and anxiety.   Notes: anal itching with BM    VITAL SIGNS:    BP 137/77 mmHg  Pulse 57 /min  Temperature 98.0 F / 36.6 C    MULTI-SYSTEM PHYSICAL EXAMINATION:    Constitutional: Well-nourished. No physical deformities. Normally developed. Good grooming.   Neck: Neck symmetrical, not swollen. Normal tracheal position.   Respiratory: Normal breath sounds. No labored breathing, no use of accessory muscles.   Cardiovascular: Regular rate and rhythm. No murmur, no gallop. Normal temperature, normal extremity pulses, no swelling, no varicosities.   Lymphatic: No enlargement, no tenderness of axillae, groin, neck lymph nodes.  Skin: No paleness, no jaundice, no cyanosis. No lesion, no ulcer, no rash.   Neurologic / Psychiatric: Oriented to time, oriented to place, oriented to person. No depression, no anxiety, no agitation.   Gastrointestinal: No mass, no tenderness, no rigidity, non obese abdomen.   Eyes: Normal conjunctivae. Normal eyelids.   Ears, Nose, Mouth, and Throat: Left ear no scars, no lesions, no masses. Right ear no scars, no lesions, no masses. Nose no scars, no lesions, no masses. Normal hearing. Normal lips.   Musculoskeletal: Normal gait and station of head and neck.     PAST  DATA REVIEWED:  Source Of History:  Patient  Urine Test Review:   Urinalysis   11/07/16 08/08/16 04/28/16 02/03/16 10/28/15 09/04/15 09/04/14  PSA  Total PSA 7.78 ng/dl 5.05 ng/dl 5.15  4.48  4.22 ng/dl 6.12 ng/dl 3.73 ng/dl  Free PSA 0.97 ng/dl 0.70 ng/dl  0.73  0.65 ng/dl    % Free PSA 12 % 14 %  16  15 %      02/27/17  Urinalysis  Urine Appearance Clear   Urine Color Yellow   Urine Glucose Neg   Urine Bilirubin Neg   Urine Ketones Neg   Urine Specific Gravity 1.025   Urine Blood 2+   Urine pH 6.5   Urine Protein Neg   Urine Urobilinogen 0.2   Urine Nitrites Neg   Urine Leukocyte Esterase Neg   Urine WBC/hpf NS (Not Seen)   Urine RBC/hpf 0 - 2/hpf   Urine Epithelial Cells 0 - 5/hpf   Urine Bacteria NS (Not Seen)   Urine Mucous Not Present   Urine Yeast NS (Not Seen)   Urine Trichomonas Not Present   Urine  Cystals NS (Not Seen)   Urine Casts NS (Not Seen)   Urine Sperm Not Present    PROCEDURES:          Urinalysis w/Scope - 81001 Dipstick Dipstick Cont'd Micro  Color: Yellow Bilirubin: Neg WBC/hpf: NS (Not Seen)  Appearance: Clear Ketones: Neg RBC/hpf: 0 - 2/hpf  Specific Gravity: 1.025 Blood: 2+ Bacteria: NS (Not Seen)  pH: 6.5 Protein: Neg Cystals: NS (Not Seen)  Glucose: Neg Urobilinogen: 0.2 Casts: NS (Not Seen)    Nitrites: Neg Trichomonas: Not Present    Leukocyte Esterase: Neg Mucous: Not Present      Epithelial Cells: 0 - 5/hpf      Yeast: NS (Not Seen)      Sperm: Not Present    ASSESSMENT/PLAN:  With biopsy-proven organ confined prostate cancer he has elected to proceed with I-125 radioactive seed implant.

## 2017-03-15 ENCOUNTER — Telehealth: Payer: Self-pay | Admitting: *Deleted

## 2017-03-15 NOTE — Telephone Encounter (Signed)
CALLED PATIENT TO REMIND OF IMPLANT FOR 03-16-17, SPOKE WITH PATIENT AND HE IS AWARE OF THIS IMPLANT

## 2017-03-16 ENCOUNTER — Ambulatory Visit (HOSPITAL_BASED_OUTPATIENT_CLINIC_OR_DEPARTMENT_OTHER)
Admission: RE | Admit: 2017-03-16 | Discharge: 2017-03-16 | Disposition: A | Discharge status: Home / Self Care | Payer: Medicare HMO | Source: Ambulatory Visit | Attending: Urology | Admitting: Urology

## 2017-03-16 ENCOUNTER — Encounter (HOSPITAL_BASED_OUTPATIENT_CLINIC_OR_DEPARTMENT_OTHER): Payer: Self-pay

## 2017-03-16 ENCOUNTER — Encounter (HOSPITAL_BASED_OUTPATIENT_CLINIC_OR_DEPARTMENT_OTHER)
Admission: RE | Disposition: A | Discharge status: Home / Self Care | Payer: Self-pay | Source: Ambulatory Visit | Attending: Urology

## 2017-03-16 ENCOUNTER — Ambulatory Visit (HOSPITAL_BASED_OUTPATIENT_CLINIC_OR_DEPARTMENT_OTHER): Payer: Medicare HMO | Admitting: Anesthesiology

## 2017-03-16 ENCOUNTER — Ambulatory Visit (HOSPITAL_COMMUNITY): Payer: Medicare HMO

## 2017-03-16 DIAGNOSIS — Z803 Family history of malignant neoplasm of breast: Secondary | ICD-10-CM | POA: Diagnosis not present

## 2017-03-16 DIAGNOSIS — M549 Dorsalgia, unspecified: Secondary | ICD-10-CM | POA: Diagnosis not present

## 2017-03-16 DIAGNOSIS — Z8042 Family history of malignant neoplasm of prostate: Secondary | ICD-10-CM | POA: Diagnosis not present

## 2017-03-16 DIAGNOSIS — Z8052 Family history of malignant neoplasm of bladder: Secondary | ICD-10-CM | POA: Diagnosis not present

## 2017-03-16 DIAGNOSIS — C61 Malignant neoplasm of prostate: Secondary | ICD-10-CM | POA: Diagnosis not present

## 2017-03-16 DIAGNOSIS — Z01818 Encounter for other preprocedural examination: Secondary | ICD-10-CM

## 2017-03-16 DIAGNOSIS — R972 Elevated prostate specific antigen [PSA]: Secondary | ICD-10-CM | POA: Diagnosis not present

## 2017-03-16 HISTORY — DX: Presence of spectacles and contact lenses: Z97.3

## 2017-03-16 HISTORY — PX: RADIOACTIVE SEED IMPLANT: SHX5150

## 2017-03-16 HISTORY — PX: CYSTOSCOPY: SHX5120

## 2017-03-16 SURGERY — INSERTION, RADIATION SOURCE, PROSTATE
Anesthesia: General

## 2017-03-16 MED ORDER — FENTANYL CITRATE (PF) 100 MCG/2ML IJ SOLN
25.0000 ug | INTRAMUSCULAR | Status: DC | PRN
  Filled 2017-03-16: qty 1

## 2017-03-16 MED ORDER — ONDANSETRON HCL 4 MG/2ML IJ SOLN
INTRAMUSCULAR | Status: AC
  Filled 2017-03-16: qty 2

## 2017-03-16 MED ORDER — EPHEDRINE SULFATE 50 MG/ML IJ SOLN
INTRAMUSCULAR | Status: DC | PRN
  Administered 2017-03-16: 10 mg via INTRAVENOUS

## 2017-03-16 MED ORDER — HYDROCODONE-ACETAMINOPHEN 10-325 MG PO TABS: 1.0000 | ORAL_TABLET | ORAL | 0 refills | Status: DC | PRN

## 2017-03-16 MED ORDER — FENTANYL CITRATE (PF) 100 MCG/2ML IJ SOLN
INTRAMUSCULAR | Status: AC
  Filled 2017-03-16: qty 2

## 2017-03-16 MED ORDER — CIPROFLOXACIN HCL 500 MG PO TABS: 500.0000 mg | ORAL_TABLET | Freq: Two times a day (BID) | ORAL | 0 refills | Status: DC

## 2017-03-16 MED ORDER — EPHEDRINE 5 MG/ML INJ
INTRAVENOUS | Status: AC
  Filled 2017-03-16: qty 10

## 2017-03-16 MED ORDER — KETOROLAC TROMETHAMINE 30 MG/ML IJ SOLN
INTRAMUSCULAR | Status: DC | PRN
  Administered 2017-03-16: 30 mg via INTRAVENOUS

## 2017-03-16 MED ORDER — PROPOFOL 10 MG/ML IV BOLUS
INTRAVENOUS | Status: DC | PRN
  Administered 2017-03-16: 100 mg via INTRAVENOUS
  Administered 2017-03-16: 200 mg via INTRAVENOUS

## 2017-03-16 MED ORDER — MIDAZOLAM HCL 2 MG/2ML IJ SOLN
INTRAMUSCULAR | Status: AC
  Filled 2017-03-16: qty 2

## 2017-03-16 MED ORDER — ONDANSETRON HCL 4 MG/2ML IJ SOLN
INTRAMUSCULAR | Status: DC | PRN
  Administered 2017-03-16: 4 mg via INTRAVENOUS

## 2017-03-16 MED ORDER — FENTANYL CITRATE (PF) 100 MCG/2ML IJ SOLN
INTRAMUSCULAR | Status: DC | PRN
  Administered 2017-03-16 (×8): 25 ug via INTRAVENOUS

## 2017-03-16 MED ORDER — CIPROFLOXACIN IN D5W 400 MG/200ML IV SOLN
INTRAVENOUS | Status: AC
  Filled 2017-03-16: qty 200

## 2017-03-16 MED ORDER — FLEET ENEMA 7-19 GM/118ML RE ENEM
1.0000 | ENEMA | Freq: Once | RECTAL | Status: DC
  Filled 2017-03-16: qty 1

## 2017-03-16 MED ORDER — SODIUM CHLORIDE 0.9 % IR SOLN
Status: DC | PRN
  Administered 2017-03-16: 1000 mL via INTRAVESICAL

## 2017-03-16 MED ORDER — LACTATED RINGERS IV SOLN
INTRAVENOUS | Status: DC
Start: 2017-03-16 — End: 2017-03-16
  Administered 2017-03-16: 1000 mL via INTRAVENOUS
  Administered 2017-03-16 (×2): via INTRAVENOUS
  Filled 2017-03-16: qty 1000

## 2017-03-16 MED ORDER — DEXAMETHASONE SODIUM PHOSPHATE 4 MG/ML IJ SOLN
INTRAMUSCULAR | Status: DC | PRN
  Administered 2017-03-16: 10 mg via INTRAVENOUS

## 2017-03-16 MED ORDER — MIDAZOLAM HCL 5 MG/5ML IJ SOLN
INTRAMUSCULAR | Status: DC | PRN
  Administered 2017-03-16: 2 mg via INTRAVENOUS

## 2017-03-16 MED ORDER — ONDANSETRON HCL 4 MG/2ML IJ SOLN
4.0000 mg | Freq: Once | INTRAMUSCULAR | Status: DC | PRN
  Filled 2017-03-16: qty 2

## 2017-03-16 MED ORDER — PROPOFOL 10 MG/ML IV BOLUS
INTRAVENOUS | Status: AC
  Filled 2017-03-16: qty 40

## 2017-03-16 MED ORDER — KETOROLAC TROMETHAMINE 30 MG/ML IJ SOLN
INTRAMUSCULAR | Status: AC
  Filled 2017-03-16: qty 1

## 2017-03-16 MED ORDER — IOHEXOL 300 MG/ML  SOLN
INTRAMUSCULAR | Status: DC | PRN
  Administered 2017-03-16: 7 mL

## 2017-03-16 MED ORDER — DEXAMETHASONE SODIUM PHOSPHATE 10 MG/ML IJ SOLN
INTRAMUSCULAR | Status: AC
  Filled 2017-03-16: qty 1

## 2017-03-16 MED ORDER — CIPROFLOXACIN IN D5W 400 MG/200ML IV SOLN
400.0000 mg | INTRAVENOUS | Status: AC
  Administered 2017-03-16: 400 mg via INTRAVENOUS
  Filled 2017-03-16: qty 200

## 2017-03-16 MED ORDER — LIDOCAINE 2% (20 MG/ML) 5 ML SYRINGE
INTRAMUSCULAR | Status: AC
  Filled 2017-03-16: qty 5

## 2017-03-16 MED ORDER — LIDOCAINE HCL (CARDIAC) 20 MG/ML IV SOLN
INTRAVENOUS | Status: DC | PRN
  Administered 2017-03-16: 100 mg via INTRAVENOUS

## 2017-03-16 SURGICAL SUPPLY — 30 items
BAG URINE DRAINAGE (UROLOGICAL SUPPLIES) ×3 IMPLANT
BLADE CLIPPER SURG (BLADE) ×3 IMPLANT
CATH FOLEY 2WAY SLVR  5CC 16FR (CATHETERS) ×1
CATH FOLEY 2WAY SLVR 5CC 16FR (CATHETERS) ×2 IMPLANT
CATH ROBINSON RED A/P 20FR (CATHETERS) ×3 IMPLANT
CLOTH BEACON ORANGE TIMEOUT ST (SAFETY) ×3 IMPLANT
COVER BACK TABLE 60X90IN (DRAPES) ×3 IMPLANT
COVER MAYO STAND STRL (DRAPES) ×3 IMPLANT
DRSG TEGADERM 4X4.75 (GAUZE/BANDAGES/DRESSINGS) ×3 IMPLANT
DRSG TEGADERM 8X12 (GAUZE/BANDAGES/DRESSINGS) ×4 IMPLANT
GAUZE SPONGE 4X4 8PLY STR LF (GAUZE/BANDAGES/DRESSINGS) ×1 IMPLANT
GLOVE BIO SURGEON STRL SZ7 (GLOVE) ×2 IMPLANT
GLOVE BIO SURGEON STRL SZ8 (GLOVE) ×3 IMPLANT
GLOVE BIOGEL PI IND STRL 7.5 (GLOVE) IMPLANT
GLOVE BIOGEL PI INDICATOR 7.5 (GLOVE) ×5
GLOVE ECLIPSE 8.0 STRL XLNG CF (GLOVE) IMPLANT
GOWN STRL REUS W/TWL XL LVL3 (GOWN DISPOSABLE) ×3 IMPLANT
HOLDER FOLEY CATH W/STRAP (MISCELLANEOUS) IMPLANT
IMPL SPACEOAR SYSTEM 10ML (MISCELLANEOUS) IMPLANT
IMPLANT SPACEOAR SYSTEM 10ML (MISCELLANEOUS) ×3
IV NS 1000ML (IV SOLUTION) ×3
IV NS 1000ML BAXH (IV SOLUTION) ×2 IMPLANT
KIT RM TURNOVER CYSTO AR (KITS) ×3 IMPLANT
PACK CYSTO (CUSTOM PROCEDURE TRAY) ×3 IMPLANT
SUT BONE WAX W31G (SUTURE) ×3 IMPLANT
SYRINGE 10CC LL (SYRINGE) IMPLANT
TUBE CONNECTING 12X1/4 (SUCTIONS) IMPLANT
UNDERPAD 30X30 INCONTINENT (UNDERPADS AND DIAPERS) ×6 IMPLANT
WATER STERILE IRR 500ML POUR (IV SOLUTION) ×3 IMPLANT
selectSeed I-125 ×1 IMPLANT

## 2017-03-16 NOTE — Anesthesia Postprocedure Evaluation (Signed)
Anesthesia Post Note  Patient: Robert Perez  Procedure(s) Performed: RADIOACTIVE SEED IMPLANT/BRACHYTHERAPY IMPLANT WITH SPACE OAR INSTILLATION (N/A ) CYSTOSCOPY     Patient location during evaluation: PACU Anesthesia Type: General Level of consciousness: awake, awake and alert and oriented Pain management: pain level controlled Vital Signs Assessment: post-procedure vital signs reviewed and stable Respiratory status: spontaneous breathing, respiratory function stable and nonlabored ventilation Cardiovascular status: blood pressure returned to baseline Anesthetic complications: no    Last Vitals:  Vitals:   03/16/17 1300 03/16/17 1355  BP: 132/83 119/64  Pulse: (!) 48 (!) 48  Resp: 13 16  Temp:  (!) 36 C  SpO2: 100% 100%    Last Pain:  Vitals:   03/16/17 1355  TempSrc: Oral  PainSc:                  Quintin Hjort COKER

## 2017-03-16 NOTE — Op Note (Signed)
PATIENT:  [name]  PRE-OPERATIVE DIAGNOSIS:  Adenocarcinoma of the prostate  POST-OPERATIVE DIAGNOSIS:  Same  PROCEDURE:  1. I-125 radioactive seed implantation 2. Cystoscopy  3. Placement of SpaceOAR  SURGEON:  Surgeon(s): Claybon Jabs  Radiation oncologist: Dr. Tyler Pita  ANESTHESIA:  General  EBL:  Minimal  DRAINS: none  INDICATION: HOLMAN BONSIGNORE is a 66 year old male with biopsy-proven adenocarcinoma the prostate who has elected to proceed with radioactive seed implantation for definitive management.  Description of procedure: After informed consent the patient was brought to the major OR, placed on the table and administered general anesthesia. He was then moved to the modified lithotomy position with his perineum perpendicular to the floor. His perineum and genitalia were then sterilely prepped. An official timeout was then performed. A 16 French Foley catheter was then placed in the bladder and filled with dilute contrast, a rectal tube was placed in the rectum and the transrectal ultrasound probe was placed in the rectum and affixed to the stand. He was then sterilely draped.  Real time ultrasonography was used along with the seed planning software On[employer] Prostate vs. 4.2.2.4. This was used to develop the seed plan including the number of needles as well as number of seeds required for complete and adequate coverage. Real-time ultrasonography was then used along with the previously developed plan and the Nucletron device to implant a total of 68 seeds using 22 needles. This proceeded without difficulty or complication.   I then proceeded with placement of SpaceOARby introducing a needle with the bevel angled inferiorly approximately 2 cm superior to the anus. This was angled downward and under direct ultrasound was placed within the space between the prostatic capsule and rectum. This was confirmed with a small amount of sterile saline injected and this was  performed under direct ultrasound. I then attached the SpaceOARto the needle and injected this in the space between the prostate and rectum with good placement noted.  A Foley catheter was then removed as well as the transrectal ultrasound probe and rectal probe. Flexible cystoscopy was then performed using the 17 French flexible scope which revealed a normal urethra throughout its length down to the sphincter which appeared intact. The prostatic urethra revealed bilobar hypertrophy but no evidence of obstruction, seeds, spacers or lesions. The bladder was then entered and fully and systematically inspected. The ureteral orifices were noted to be of normal configuration and position. The mucosa revealed no evidence of tumors. There were also no stones identified within the bladder. I noted no seeds or spacers on the floor of the bladder and retroflexion of the scope revealed no seeds protruding from the base of the prostate.  The cystoscope was then removed and the patient was awakened and taken to recovery room in stable and satisfactory condition. He tolerated procedure well and there were no intraoperative complications.

## 2017-03-16 NOTE — Anesthesia Preprocedure Evaluation (Signed)

## 2017-03-16 NOTE — Anesthesia Procedure Notes (Signed)
Procedure Name: LMA Insertion Date/Time: 03/16/2017 9:45 AM Performed by: Justice Rocher Pre-anesthesia Checklist: Patient identified, Emergency Drugs available, Suction available and Patient being monitored Patient Re-evaluated:Patient Re-evaluated prior to induction Oxygen Delivery Method: Circle system utilized Preoxygenation: Pre-oxygenation with 100% oxygen Induction Type: IV induction Ventilation: Mask ventilation without difficulty LMA: LMA inserted LMA Size: 5.0 Number of attempts: 1 Airway Equipment and Method: Bite block Placement Confirmation: positive ETCO2 and breath sounds checked- equal and bilateral Tube secured with: Tape Dental Injury: Teeth and Oropharynx as per pre-operative assessment

## 2017-03-16 NOTE — Progress Notes (Signed)
  Radiation Oncology         [phone] ________________________________  Name: KYLOR VALVERDE MRN: [id number]  Date: 03/16/2017  DOB: 04/25/1951       Prostate Seed Implant  NL:ZJQBHA, Elveria Rising, MD  No ref. provider found  DIAGNOSIS: 66 y.o.gentleman with Stage T1c adenocarcinoma of the prostate with Gleason Score of 3 + 4, and PSA of 7.78     ICD-10-CM   1. Pre-op testing Z01.818 DG Chest 2 View    DG Chest 2 View   PROCEDURE: Insertion of radioactive I-125 seeds into the prostate gland.  RADIATION DOSE: 145 Gy, definitive therapy.  TECHNIQUE: KAMARRI LOVVORN was brought to the operating room with the urologist. He was placed in the dorsolithotomy position. He was catheterized and a rectal tube was inserted. The perineum was shaved, prepped and draped. The ultrasound probe was then introduced into the rectum to see the prostate gland.  TREATMENT DEVICE: A needle grid was attached to the ultrasound probe stand and anchor needles were placed.  3D PLANNING: The prostate was imaged in 3D using a sagittal sweep of the prostate probe. These images were transferred to the planning computer. There, the prostate, urethra and rectum were defined on each axial reconstructed image. Then, the software created an optimized 3D plan and a few seed positions were adjusted. The quality of the plan was reviewed using [facility] information for the target and the following two organs at risk:  Urethra and Rectum.  Then the accepted plan was uploaded to the seed Selectron afterloading unit.  PROSTATE VOLUME STUDY:  Using transrectal ultrasound the volume of the prostate was verified to be 42 cc.  SPECIAL TREATMENT PROCEDURE/SUPERVISION AND HANDLING: The Nucletron FIRST system was used to place the needles under sagittal guidance. A total of 22 needles were used to deposit 68 seeds in the prostate gland. The individual seed activity was 0.459 mCi.  SpaceOAR:  Yes  COMPLEX SIMULATION: At the end of the procedure,  an anterior radiograph of the pelvis was obtained to document seed positioning and count. Cystoscopy was performed to check the urethra and bladder.  MICRODOSIMETRY: At the end of the procedure, the patient was emitting 0.124 mR/hr at 1 meter. Accordingly, he was considered safe for hospital discharge.  PLAN: The patient will return to the radiation oncology clinic for post implant CT dosimetry in three weeks.   ________________________________  Sheral Apley Tammi Klippel, M.D.

## 2017-03-16 NOTE — Transfer of Care (Signed)
Immediate Anesthesia Transfer of Care Note  Patient: Robert Perez  Procedure(s) Performed: Procedure(s) (LRB): RADIOACTIVE SEED IMPLANT/BRACHYTHERAPY IMPLANT WITH SPACE OAR INSTILLATION (N/A) CYSTOSCOPY  Patient Location: PACU  Anesthesia Type: General  Level of Consciousness: awake, sedated, patient cooperative and responds to stimulation  Airway & Oxygen Therapy: Patient Spontanous Breathing and Patient connected to [place] oxygen  Post-op Assessment: Report given to PACU RN, Post -op Vital signs reviewed and stable and Patient moving all extremities  Post vital signs: Reviewed and stable  Complications: No apparent anesthesia complications

## 2017-03-16 NOTE — Discharge Instructions (Signed)
No advil, aleve, motrin, ibuprofen until 5 pm today  DISCHARGE INSTRUCTIONS FOR PROSTATE SEED IMPLANTATION  Antibiotics You may be given a prescription for an antibiotic to take when you arrive home. If so, be sure to take every tablet in the bottle, even if you are feeling better before the prescription is finished. If you begin itching, notice a rash or start to swell on your trunk, arms, legs and/or throat, immediately stop taking the antibiotic and call your Urologist. Diet Resume your usual diet when you return home. To keep your bowels moving easily and softly, drink prune, apple and cranberry juice at room temperature. You may also take a stool softener, such as Colace, which is available without prescription at local pharmacies. Daily activities ? No driving or heavy lifting for at least two days after the implant. ? No bike riding, horseback riding or riding lawn mowers for the first month after the implant. ? Any strenuous physical activity should be approved by your doctor before you resume it. Sexual relations You may resume sexual relations two weeks after the procedure. A condom should be used for the first two weeks. Your semen may be dark brown or black; this is normal and is related bleeding that may have occurred during the implant. Postoperative swelling Expect swelling and bruising of the scrotum and perineum (the area between the scrotum and anus). Both the swelling and the bruising should resolve in l or 2 weeks. Ice packs and over- the-counter medications such as Tylenol, Advil or Aleve may lessen your discomfort. Postoperative urination Most men experience burning on urination and/or urinary frequency. If this becomes bothersome, contact your Urologist.  Medication can be prescribed to relieve these problems.  It is normal to have some blood in your urine for a few days after the implant. Special instructions related to the seeds It is unlikely that you will pass an  Iodine-125 seed in your urine. The seeds are silver in color and are about as large as a grain of rice. If you pass a seed, do not handle it with your fingers. Use a spoon to place it in an envelope or jar in place this in base occluded area such as the garage or basement for return to the radiation clinic at your convenience.  Contact your doctor for ? Temperature greater than 101 F ? Increasing pain ? Inability to urinate Follow-up  You should have follow up with your urologist and radiation oncologist about 3 weeks after the procedure. General information regarding Iodine seeds ? Iodine-125 is a low energy radioactive material. It is not deeply penetrating and loses energy at short distances. Your prostate will absorb the radiation. Objects that are touched or used by the patient do not become radioactive. ? Body wastes (urine and stool) or body fluids (saliva, tears, semen or blood) are not radioactive. ? The Nuclear Regulatory Commission [facility]) has determined that no radiation precautions are needed for patients undergoing Iodine-125 seed implantation. The [facility] states that such patients do not present a risk to the people around them, including young children and pregnant women. However, in keeping with the general principle that radiation exposure should be kept as low reasonably possible, we suggest the following: ? Children and pets should not sit on the patient's lap for the first two (2) weeks after the implant. ? Pregnant (or possibly pregnant) women should avoid prolonged, close contact with the patient for the first two (2) weeks after the implant. ? A distance of three (3)  feet is acceptable. ? At a distance of three (3) feet, there is no limit to the length of time anyone can be with the patient.    Post Anesthesia Home Care Instructions  Activity: Get plenty of rest for the remainder of the day. A responsible individual must stay with you for 24 hours following the procedure.    For the next 24 hours, DO NOT: -Drive a car -Paediatric nurse -Drink alcoholic beverages -Take any medication unless instructed by your physician -Make any legal decisions or sign important papers.  Meals: Start with liquid foods such as gelatin or soup. Progress to regular foods as tolerated. Avoid greasy, spicy, heavy foods. If nausea and/or vomiting occur, drink only clear liquids until the nausea and/or vomiting subsides. Call your physician if vomiting continues.  Special Instructions/Symptoms: Your throat may feel dry or sore from the anesthesia or the breathing tube placed in your throat during surgery. If this causes discomfort, gargle with warm salt water. The discomfort should disappear within 24 hours.  If you had a scopolamine patch placed behind your ear for the management of post- operative nausea and/or vomiting:  1. The medication in the patch is effective for 72 hours, after which it should be removed.  Wrap patch in a tissue and discard in the trash. Wash hands thoroughly with soap and water. 2. You may remove the patch earlier than 72 hours if you experience unpleasant side effects which may include dry mouth, dizziness or visual disturbances. 3. Avoid touching the patch. Wash your hands with soap and water after contact with the patch.

## 2017-03-19 ENCOUNTER — Encounter (HOSPITAL_BASED_OUTPATIENT_CLINIC_OR_DEPARTMENT_OTHER): Payer: Self-pay | Admitting: Urology

## 2017-04-05 ENCOUNTER — Telehealth: Payer: Self-pay | Admitting: *Deleted

## 2017-04-05 ENCOUNTER — Other Ambulatory Visit: Payer: Self-pay | Admitting: Urology

## 2017-04-05 DIAGNOSIS — C61 Malignant neoplasm of prostate: Secondary | ICD-10-CM

## 2017-04-05 NOTE — Telephone Encounter (Signed)
Called patient to remind of post seed appts. For 04-06-17, spoke with patient and he is aware of these appts.

## 2017-04-06 ENCOUNTER — Ambulatory Visit
Admission: RE | Admit: 2017-04-06 | Discharge: 2017-04-06 | Disposition: A | Discharge status: Home / Self Care | Payer: Medicare HMO | Source: Ambulatory Visit | Attending: Radiation Oncology | Admitting: Radiation Oncology

## 2017-04-06 ENCOUNTER — Encounter: Payer: Self-pay | Admitting: Medical Oncology

## 2017-04-06 DIAGNOSIS — Z510 Encounter for antineoplastic radiation therapy: Secondary | ICD-10-CM | POA: Diagnosis not present

## 2017-04-06 DIAGNOSIS — C61 Malignant neoplasm of prostate: Secondary | ICD-10-CM | POA: Insufficient documentation

## 2017-04-06 DIAGNOSIS — R3915 Urgency of urination: Secondary | ICD-10-CM | POA: Diagnosis not present

## 2017-04-06 DIAGNOSIS — Z809 Family history of malignant neoplasm, unspecified: Secondary | ICD-10-CM | POA: Diagnosis not present

## 2017-04-06 NOTE — Progress Notes (Signed)
  Radiation Oncology         [phone] ________________________________  Name: Robert Perez MRN: [id number]  Date: 04/06/2017  DOB: [birth year]  Follow-Up Visit Note  CC: Tonia Ghent, MD  Tonia Ghent, MD  Diagnosis:   Stage T1c adenocarcinoma of the prostate with Gleason Score of 3 + 4, and PSA of 7.78    ICD-10-CM   1. Malignant neoplasm of prostate ([place]) C61     Interval Since Last Radiation:  3 weeks  Narrative:  The patient returns today for routine follow-up.  He is complaining of increased urinary frequency and urinary hesitation symptoms. He filled out a questionnaire regarding urinary function today providing and overall IPSS score of 21 characterizing his symptoms as severe.  His pre-implant score was 4. He denies any bowel symptoms.  ALLERGIES:  is allergic to codeine.  Meds: No current outpatient medications on file.   No current facility-administered medications for this encounter.     Physical Findings: The patient is in no acute distress. Patient is alert and oriented.  weight is 187 lb (84.8 kg). His oral temperature is 98 F (36.7 C). His blood pressure is 134/77 and his pulse is 55 (abnormal). His respiration is 18 and oxygen saturation is 100%. .  No significant changes.  Lab Findings: Lab Results  Component Value Date   WBC 5.3 03/09/2017   HGB 16.1 03/09/2017   HCT 47.1 03/09/2017   MCV 88.0 03/09/2017   PLT 253 03/09/2017    Radiographic Findings:  Patient underwent CT imaging in our clinic for post implant dosimetry. The CT appears to demonstrate an adequate distribution of radioactive seeds throughout the prostate gland. There no seeds in her near the rectum. I suspect the final radiation plan and dosimetry will show appropriate coverage of the prostate gland.   Impression: The patient is recovering from the effects of radiation. His urinary symptoms should gradually improve over the next 4-6 months. We talked about this today. He is  encouraged by his improvement already and is otherwise please with his outcome.   Plan: Today, I spent time talking to the patient about his prostate seed implant and resolving urinary symptoms. We also talked about long-term follow-up for prostate cancer following seed implant. He understands that ongoing PSA determinations and digital rectal exams will help perform surveillance to rule out disease recurrence. He understands what to expect with his PSA measures. Patient was also educated today about some of the long-term effects from radiation including a small risk for rectal bleeding and possibly erectile dysfunction. We talked about some of the general management approaches to these potential complications. However, I did encourage the patient to contact our office or return at any point if he has questions or concerns related to his previous radiation and prostate cancer.  _____________________________________  Sheral Apley. Tammi Klippel, M.D.   This document serves as a record of services personally performed by Tyler Pita, MD. It was created on his behalf by Margit Banda, a trained medical scribe. The creation of this record is based on the scribe's personal observations and the provider's statements to them. This document has been checked and approved by the attending provider.

## 2017-04-06 NOTE — Progress Notes (Signed)
  Radiation Oncology         [phone] ________________________________  Name: TRYSTEN BERNARD MRN: [id number]  Date: 04/06/2017  DOB: 01-15-1951  COMPLEX SIMULATION NOTE  NARRATIVE:  The patient was brought to the [facility] suite today following prostate seed implantation approximately one month ago.  Identity was confirmed.  All relevant records and images related to the planned course of therapy were reviewed.  Then, the patient was set-up supine.  CT images were obtained.  The CT images were loaded into the planning software.  Then the prostate and rectum were contoured.  Treatment planning then occurred.  The implanted iodine 125 seeds were identified by the physics staff for projection of radiation distribution  I have requested : 3D Simulation  I have requested a DVH of the following structures: Prostate and rectum.    ________________________________  Sheral Apley Tammi Klippel, M.D.  This document serves as a record of services personally performed by Tyler Pita, MD. It was created on his behalf by Margit Banda, a trained medical scribe. The creation of this record is based on the scribe's personal observations and the provider's statements to them. This document has been checked and approved by the attending provider.

## 2017-04-06 NOTE — Progress Notes (Signed)
Patient returns to clinic for post seed follow up with Dr. Tammi Klippel. Weight and vitals stable. Patient denies pain. Pre seed IPSS 4. Post seed IPSS 21. Incomplete emptying and intermittency are his biggest complaints. Patient reports mild dysuria. Denies hematuria, leakage or incontinence. Patient seen by Fritz Pickerel, [place] at [facility] earlier and prescribed Flomax to manage said symptoms.   BP 134/77   Pulse (!) 55   Temp 98 F (36.7 C) (Oral)   Resp 18   Wt 187 lb (84.8 kg)   SpO2 100%   BMI 24.67 kg/m  Wt Readings from Last 3 Encounters:  04/06/17 187 lb (84.8 kg)  03/16/17 185 lb (83.9 kg)  01/08/17 189 lb (85.7 kg)

## 2017-04-11 ENCOUNTER — Telehealth: Payer: Self-pay | Admitting: *Deleted

## 2017-04-11 NOTE — Telephone Encounter (Signed)
Spoke with patient and he is aware of the MRI on 04-20-17

## 2017-04-11 NOTE — Telephone Encounter (Signed)
Called patient to inform of MRI for 04-20-17 - arrival time - 3:45 pm @ [facility], no restrictions to test

## 2017-04-20 ENCOUNTER — Ambulatory Visit (HOSPITAL_COMMUNITY)
Admission: RE | Admit: 2017-04-20 | Discharge: 2017-04-20 | Disposition: A | Discharge status: Home / Self Care | Payer: Medicare HMO | Source: Ambulatory Visit | Attending: Urology | Admitting: Urology

## 2017-04-20 DIAGNOSIS — C61 Malignant neoplasm of prostate: Secondary | ICD-10-CM | POA: Diagnosis not present

## 2017-04-25 ENCOUNTER — Encounter: Payer: Self-pay | Admitting: Radiation Oncology

## 2017-04-25 DIAGNOSIS — Z510 Encounter for antineoplastic radiation therapy: Secondary | ICD-10-CM | POA: Diagnosis not present

## 2017-04-25 DIAGNOSIS — C61 Malignant neoplasm of prostate: Secondary | ICD-10-CM | POA: Diagnosis not present

## 2017-05-03 NOTE — Progress Notes (Signed)
  Radiation Oncology         [phone] ________________________________  Name: Robert Perez MRN: [id number]  Date: 04/25/2017  DOB: 1950/09/05  3D Planning Note   Prostate Brachytherapy Post-Implant Dosimetry  Diagnosis: Stage T1c adenocarcinoma of the prostate with Gleason Score of 3 + 4, and PSA of 7.78  Narrative: On a previous date, Robert Perez returned following prostate seed implantation for post implant planning. He underwent CT scan complex simulation to delineate the three-dimensional structures of the pelvis and demonstrate the radiation distribution.  Since that time, the seed localization, and complex isodose planning with dose volume histograms have now been completed.  Results:   Prostate Coverage - The dose of radiation delivered to the 90% or more of the prostate gland (D90) was 89.59% of the prescription dose. This essentially meets our goal of greater than 90%. Rectal Sparing - The volume of rectal tissue receiving the prescription dose or higher was 0 cc. This falls under our thresholds tolerance of 1.0 cc.  Impression: The prostate seed implant appears to show adequate target coverage and appropriate rectal sparing.  Plan:  The patient will continue to follow with urology for ongoing PSA determinations. I would anticipate a high likelihood for local tumor control with minimal risk for rectal morbidity.  ________________________________  Sheral Apley Tammi Klippel, M.D.  This document serves as a record of services personally performed by Tyler Pita, MD. It was created on his behalf by Linward Natal, a trained medical scribe. The creation of this record is based on the scribe's personal observations and the provider's statements to them. This document has been checked and approved by the attending provider.

## 2017-07-04 DIAGNOSIS — R972 Elevated prostate specific antigen [PSA]: Secondary | ICD-10-CM | POA: Diagnosis not present

## 2017-07-10 DIAGNOSIS — R3911 Hesitancy of micturition: Secondary | ICD-10-CM | POA: Diagnosis not present

## 2017-07-10 DIAGNOSIS — N401 Enlarged prostate with lower urinary tract symptoms: Secondary | ICD-10-CM | POA: Diagnosis not present

## 2017-09-04 ENCOUNTER — Ambulatory Visit: Payer: Self-pay

## 2017-09-04 NOTE — Telephone Encounter (Signed)
Patient called in with c/o "bleeding in stool." He says "I saw blood in the stool and on the tissue. I feel a burning sensation and have a bowel movement, soft movement, and then I see the blood. It happened twice, last Thursday and last Saturday. Both movements were soft, not constipated. I had a bowel movement today and didn't see the blood in the stool or on the tissue, and I didn't have the burning sensation." According to protocol, see PCP within 3 days, appointment scheduled for Thursday, 09/06/17 with Dr. Damita Dunnings, care advice given, patient verbalized understanding.   Reason for Disposition . MILD rectal bleeding (more than just a few drops or streaks)  Answer Assessment - Initial Assessment Questions 1. APPEARANCE of BLOOD: "What color is it?" "Is it passed separately, on the surface of the stool, or mixed in with the stool?"      Bright red, mixed in the stool and on tissue 2. AMOUNT: "How much blood was passed?"      Toilet water gets red 3. FREQUENCY: "How many times has blood been passed with the stools?"      Twice; not today 4. ONSET: "When was the blood first seen in the stools?" (Days or weeks)      Last Thursday around noon; Saturday 5. DIARRHEA: "Is there also some diarrhea?" If so, ask: "How many diarrhea stools were passed in past 24 hours?"      No 6. CONSTIPATION: "Do you have constipation?" If so, "How bad is it?"     No 7. RECURRENT SYMPTOMS: "Have you had blood in your stools before?" If so, ask: "When was the last time?" and "What happened that time?"      No 8. BLOOD THINNERS: "Do you take any blood thinners?" (e.g., Coumadin/warfarin, Pradaxa/dabigatran, aspirin)    No 9. OTHER SYMPTOMS: "Do you have any other symptoms?"  (e.g., abdominal pain, vomiting, dizziness, fever)     No 10. PREGNANCY: "Is there any chance you are pregnant?" "When was your last menstrual period?"       N/A  Protocols used: RECTAL BLEEDING-A-AH

## 2017-09-06 ENCOUNTER — Encounter: Payer: Self-pay | Admitting: Family Medicine

## 2017-09-06 ENCOUNTER — Ambulatory Visit (INDEPENDENT_AMBULATORY_CARE_PROVIDER_SITE_OTHER): Payer: Medicare HMO | Admitting: Family Medicine

## 2017-09-06 DIAGNOSIS — M79646 Pain in unspecified finger(s): Secondary | ICD-10-CM | POA: Diagnosis not present

## 2017-09-06 DIAGNOSIS — K625 Hemorrhage of anus and rectum: Secondary | ICD-10-CM | POA: Diagnosis not present

## 2017-09-06 MED ORDER — NITROGLYCERIN 2 % TD OINT: TOPICAL_OINTMENT | TRANSDERMAL | 0 refills | Status: DC

## 2017-09-06 NOTE — Patient Instructions (Addendum)
Ease back into playing bass and try a thumb spica brace to take some of the pressure off.  Try icing after playing.   Use a stool softener or bran as needed to keep your stools soft.  If you have more trouble then try using nitroglycerin locally.    Take care.  Glad to see you.  Update me as needed.

## 2017-09-06 NOTE — Progress Notes (Signed)
He hasn't been able to bike as much recently but is going to get back to it.  D/w pt.   He has been seeing urology in the meantime.  On flomax.  I'll defer.  He agrees.   Itching rectally for the year or so.  He tried OTC tx.  Last week he has a hard stool with burning thereafter.  BRBPR at the time.  No bleeding with a soft stool.  No BRBPR with the last 3 stools.  No pain except with BM.    He doesn't feel sick o/w.  No blood in urine.  No black stools.  No abd pain.  No other bleeding.    He recently had diffuse L thumb pain.  Woke up with pain.  He had been playing bass a lot prior to the pain starting.  His L hand his is fret hand.    Meds, vitals, and allergies reviewed.   ROS: Per HPI unless specifically indicated in ROS section   nad ncat Normal external rectal exam. No fissure seen.  No gross blood.  Normal ROM and inspection of L thumb/hand.

## 2017-09-08 DIAGNOSIS — K625 Hemorrhage of anus and rectum: Secondary | ICD-10-CM | POA: Insufficient documentation

## 2017-09-08 DIAGNOSIS — M79646 Pain in unspecified finger(s): Secondary | ICD-10-CM | POA: Insufficient documentation

## 2017-09-08 NOTE — Assessment & Plan Note (Signed)
Likely had relative overuse with tendinitis related to playing bass.  He can get a spica brace that may help reinforce the thumb.  I talked to him about playing a little more often but a little less per episode.  Update me as needed.  He agrees.

## 2017-09-08 NOTE — Assessment & Plan Note (Signed)
See after visit summary.  He likely has a resolved rectal fissure.  Anatomy discussed with patient. Use a stool softener or bran as needed to keep stools soft.  If more trouble then try using nitroglycerin locally.    If recurrent bleeding that he will update me.  Okay for outpatient follow-up.

## 2017-09-20 ENCOUNTER — Other Ambulatory Visit: Payer: Self-pay | Admitting: Family Medicine

## 2017-09-20 ENCOUNTER — Ambulatory Visit: Payer: Medicare HMO

## 2017-09-20 ENCOUNTER — Ambulatory Visit (INDEPENDENT_AMBULATORY_CARE_PROVIDER_SITE_OTHER): Payer: Medicare HMO

## 2017-09-20 VITALS — BP 120/80 | HR 68 | Temp 98.4°F | Ht 71.5 in | Wt 188.0 lb

## 2017-09-20 DIAGNOSIS — Z0000 Encounter for general adult medical examination without abnormal findings: Secondary | ICD-10-CM | POA: Diagnosis not present

## 2017-09-20 DIAGNOSIS — E785 Hyperlipidemia, unspecified: Secondary | ICD-10-CM

## 2017-09-20 LAB — COMPREHENSIVE METABOLIC PANEL
ALBUMIN: 4.1 g/dL (ref 3.5–5.2)
ALK PHOS: 44 U/L (ref 39–117)
ALT: 16 U/L (ref 0–53)
AST: 16 U/L (ref 0–37)
BILIRUBIN TOTAL: 0.5 mg/dL (ref 0.2–1.2)
BUN: 17 mg/dL (ref 6–23)
CO2: 32 mEq/L (ref 19–32)
CREATININE: 1.01 mg/dL (ref 0.40–1.50)
Calcium: 9.4 mg/dL (ref 8.4–10.5)
Chloride: 104 mEq/L (ref 96–112)
GFR: 94.28 mL/min (ref >60.00)
GLUCOSE: 98 mg/dL (ref 70–99)
Potassium: 4.6 mEq/L (ref 3.5–5.1)
SODIUM: 138 meq/L (ref 135–145)
TOTAL PROTEIN: 6.7 g/dL (ref 6.0–8.3)

## 2017-09-20 LAB — LIPID PANEL
CHOLESTEROL: 206 mg/dL — AB (ref 0–200)
HDL: 65.1 mg/dL (ref >39.00)
LDL Cholesterol: 128 mg/dL — ABNORMAL HIGH (ref 0–99)
NonHDL: 141.15
Total CHOL/HDL Ratio: 3
Triglycerides: 67 mg/dL (ref 0.0–149.0)
VLDL: 13.4 mg/dL (ref 0.0–40.0)

## 2017-09-20 NOTE — Progress Notes (Signed)
PCP notes:   Health maintenance:  Tetanus vaccine - pt will attempt to get vaccine through employer  Abnormal screenings:   None  Patient concerns:   None  Nurse concerns:  None  Next PCP appt:   09/27/2017 @ 1415  I reviewed health advisor's note, was available for consultation on the day of service listed in this note, and agree with documentation and plan. Elsie Stain, MD.

## 2017-09-20 NOTE — Patient Instructions (Signed)
Mr. Storie , Thank you for taking time to come for your Medicare Wellness Visit. I appreciate your ongoing commitment to your health goals. Please review the following plan we discussed and let me know if I can assist you in the future.   These are the goals we discussed: Goals    . Reduce portion size     Starting 09/20/2017, I will continue to reduce intake of simple carbohydrates by limiting intake of potato chips and ice cream.       This is a list of the screening recommended for you and due dates:  Health Maintenance  Topic Date Due  . Tetanus Vaccine  09/25/2017*  . Flu Shot  12/27/2017  . Colon Cancer Screening  05/03/2023  .  Hepatitis C: One time screening is recommended by Center for Disease Control  (CDC) for  adults born from [birth year] through 1967.   Completed  . Pneumonia vaccines  Completed  *Topic was postponed. The date shown is not the original due date.   Preventive Care for Adults  A healthy lifestyle and preventive care can promote health and wellness. Preventive health guidelines for adults include the following key practices.  . A routine yearly physical is a good way to check with your health care provider about your health and preventive screening. It is a chance to share any concerns and updates on your health and to receive a thorough exam.  . Visit your dentist for a routine exam and preventive care every 6 months. Brush your teeth twice a day and floss once a day. Good oral hygiene prevents tooth decay and gum disease.  . The frequency of eye exams is based on your age, health, family medical history, use  of contact lenses, and other factors. Follow your health care provider's recommendations for frequency of eye exams.  . Eat a healthy diet. Foods like vegetables, fruits, whole grains, low-fat dairy products, and lean protein foods contain the nutrients you need without too many calories. Decrease your intake of foods high in solid fats, added sugars, and  salt. Eat the right amount of calories for you. Get information about a proper diet from your health care provider, if necessary.  . Regular physical exercise is one of the most important things you can do for your health. Most adults should get at least 150 minutes of moderate-intensity exercise (any activity that increases your heart rate and causes you to sweat) each week. In addition, most adults need muscle-strengthening exercises on 2 or more days a week.  Silver Sneakers may be a benefit available to you. To determine eligibility, you may visit the website: www.silversneakers.com or contact program at (445) 214-2594 Mon-Fri between 8AM-8PM.   . Maintain a healthy weight. The body mass index (BMI) is a screening tool to identify possible weight problems. It provides an estimate of body fat based on height and weight. Your health care provider can find your BMI and can help you achieve or maintain a healthy weight.   For adults 20 years and older: ? A BMI below 18.5 is considered underweight. ? A BMI of 18.5 to 24.9 is normal. ? A BMI of 25 to 29.9 is considered overweight. ? A BMI of 30 and above is considered obese.   . Maintain normal blood lipids and cholesterol levels by exercising and minimizing your intake of saturated fat. Eat a balanced diet with plenty of fruit and vegetables. Blood tests for lipids and cholesterol should begin at age 93 and be  repeated every 5 years. If your lipid or cholesterol levels are high, you are over 50, or you are at high risk for heart disease, you may need your cholesterol levels checked more frequently. Ongoing high lipid and cholesterol levels should be treated with medicines if diet and exercise are not working.  . If you smoke, find out from your health care provider how to quit. If you do not use tobacco, please do not start.  . If you choose to drink alcohol, please do not consume more than 2 drinks per day. One drink is considered to be 12 ounces  (355 mL) of beer, 5 ounces (148 mL) of wine, or 1.5 ounces (44 mL) of liquor.  . If you are 84-83 years old, ask your health care provider if you should take aspirin to prevent strokes.  . Use sunscreen. Apply sunscreen liberally and repeatedly throughout the day. You should seek shade when your shadow is shorter than you. Protect yourself by wearing long sleeves, pants, a wide-brimmed hat, and sunglasses year round, whenever you are outdoors.  . Once a month, do a whole body skin exam, using a mirror to look at the skin on your back. Tell your health care provider of new moles, moles that have irregular borders, moles that are larger than a pencil eraser, or moles that have changed in shape or color.

## 2017-09-20 NOTE — Progress Notes (Signed)
Subjective:   Robert Perez is a 67 y.o. male who presents for Medicare Annual/Subsequent preventive examination.  Review of Systems:  N/A Cardiac Risk Factors include: advanced age (>77men, >57 women);male gender     Objective:    Vitals: BP 120/80 (BP Location: Left Arm, Patient Position: Sitting, Cuff Size: Normal)   Pulse 68   Temp 98.4 F (36.9 C) (Oral)   Ht 5' 11.5" (1.816 m) Comment: no shoes  Wt 188 lb (85.3 kg)   SpO2 92%   BMI 25.86 kg/m   Body mass index is 25.86 kg/m.  Advanced Directives 09/20/2017 03/16/2017 01/08/2017 09/19/2016 09/07/2015  Does Patient Have a Medical Advance Directive? No No No Yes Yes  Type of Advance Directive - - Public librarian;Living will Living will;Healthcare Power of Attorney  Does patient want to make changes to medical advance directive? - - - - No - Patient declined  Copy of [place] in Chart? - - - No - copy requested No - copy requested  Would patient like information on creating a medical advance directive? Yes (MAU/Ambulatory/Procedural Areas - Information given) No - Patient declined No - Patient declined - -    Tobacco Social History   Tobacco Use  Smoking Status Never Smoker  Smokeless Tobacco Never Used     Counseling given: No   Clinical Intake:  Pre-visit preparation completed: Yes  Pain Score: 0-No pain     Nutritional Status: BMI 25 -29 Overweight Nutritional Risks: None Diabetes: No  How often do you need to have someone help you when you read instructions, pamphlets, or other written materials from your doctor or pharmacy?: 1 - Never What is the last grade level you completed in school?: 12th grade + some college  Interpreter Needed?: No  Comments: pt and daughter live together Information entered by :: LPinson, LPN  Past Medical History:  Diagnosis Date  . Allergic rhinitis due to pollen   . Prostate cancer [facility]) urologist-  dr ottelin/oncologist-  dr Tammi Klippel   dx 12-05-2016-- Stage T1c,  Gleason 3+4,  PSA 7.78,  vol 33cc  . PSA elevation   . Wears glasses    Past Surgical History:  Procedure Laterality Date  . COLONOSCOPY  2014  . CYSTOSCOPY  03/16/2017   Procedure: CYSTOSCOPY;  Surgeon: Kathie Rhodes, MD;  [facility];  Service: Urology;;  no seeds found in bladder  . PROSTATE BIOPSY  12-05-2016   dr Karsten Ro office  . RADIOACTIVE SEED IMPLANT N/A 03/16/2017   Procedure: RADIOACTIVE SEED IMPLANT/BRACHYTHERAPY IMPLANT WITH SPACE OAR INSTILLATION;  Surgeon: Kathie Rhodes, MD;  Location[facility]  Service: Urology;  Laterality: N/A;    77 seeds implanted   Family History  Problem Relation Age of Onset  . Diabetes Mother   . Arthritis Father   . Hyperlipidemia Father   . Hypertension Father   . Kidney disease Father   . Prostate cancer Father        dx'd late in life.   . Breast cancer Sister   . Colon cancer Maternal Grandfather   . Cancer Brother   . Bladder Cancer Brother   . Breast cancer Sister   . Breast cancer Sister   . Pancreatic cancer Neg Hx   . Stomach cancer Neg Hx    Social History   Socioeconomic History  . Marital status: Single    Spouse name: Not on file  . Number of children: Not on  file  . Years of education: Not on file  . Highest education level: Not on file  Occupational History  . Not on file  Social Needs  . Financial resource strain: Not on file  . Food insecurity:    Worry: Not on file    Inability: Not on file  . Transportation needs:    Medical: Not on file    Non-medical: Not on file  Tobacco Use  . Smoking status: Never Smoker  . Smokeless tobacco: Never Used  Substance and Sexual Activity  . Alcohol use: No    Alcohol/week: 0.0 oz  . Drug use: No  . Sexual activity: Never  Lifestyle  . Physical activity:    Days per week: Not on file    Minutes per session: Not on file  . Stress: Not on file  Relationships  . Social connections:    Talks  on phone: Not on file    Gets together: Not on file    Attends religious service: Not on file    Active member of club or organization: Not on file    Attends meetings of clubs or organizations: Not on file    Relationship status: Not on file  Other Topics Concern  . Not on file  Social History Narrative   Lifelong single.   Has one adult daughter.  Daughter is working in [place] for [employer].   [employer] since 2004. 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      Outpatient Encounter Medications as of 09/20/2017  Medication Sig  . tamsulosin (FLOMAX) 0.4 MG CAPS capsule Take 0.8 mg by mouth.  . nitroGLYCERIN (NITROGLYN) 2 % ointment Use a small amount rectally twice a day if needed. (Patient not taking: Reported on 09/20/2017)   No facility-administered encounter medications on file as of 09/20/2017.     Activities of Daily Living In your present state of health, do you have any difficulty performing the following activities: 09/20/2017  Hearing? N  Vision? N  Difficulty concentrating or making decisions? N  Walking or climbing stairs? N  Dressing or bathing? N  Doing errands, shopping? N  Preparing Food and eating ? N  Using the Toilet? N  In the past six months, have you accidently leaked urine? N  Do you have problems with loss of bowel control? N  Managing your Medications? N  Managing your Finances? N  Housekeeping or managing your Housekeeping? N  Some recent data might be hidden    Patient Care Team: Tonia Ghent, MD as PCP - General (Family Medicine)   Assessment:   This is a routine wellness examination for Robert Perez.   Hearing Screening   125Hz  250Hz  500Hz  1000Hz  2000Hz  3000Hz  4000Hz  6000Hz  8000Hz   Right ear:   40 40 40  40    Left ear:   40 40 40  40      Visual Acuity Screening   Right eye Left eye Both eyes  Without correction:     With  correction: 20/20-1 20/25 20/25-1     Exercise Activities and Dietary recommendations Current Exercise Habits: The patient does not participate in regular exercise at present, Exercise limited by: None identified  Goals    . Reduce portion size     Starting 09/20/2017, I will continue to reduce intake of simple carbohydrates by limiting intake of potato  chips and ice cream.       Fall Risk Fall Risk  09/20/2017 01/08/2017 09/26/2016 09/19/2016 09/07/2015  Falls in the past year? No No No No No  Number falls in past yr: - - - - -  Injury with Fall? - - - - -   Depression Screen PHQ 2/9 Scores 09/20/2017 01/08/2017 09/26/2016 09/19/2016  PHQ - 2 Score 0 0 0 0  PHQ- 9 Score 0 - - -    Cognitive Function MMSE - Mini Mental State Exam 09/20/2017 09/19/2016 09/07/2015  Orientation to time 5 5 5   Orientation to Place 5 5 5   Registration 3 3 3   Attention/ Calculation 0 0 0  Recall 3 3 3   Language- name 2 objects 0 0 0  Language- repeat 1 1 1   Language- follow 3 step command 3 3 3   Language- read & follow direction 0 0 0  Write a sentence 0 0 0  Copy design 0 0 0  Total score 20 20 20      PLEASE NOTE: A Mini-Cog screen was completed. Maximum score is 20. A value of 0 denotes this part of Folstein MMSE was not completed or the patient failed this part of the Mini-Cog screening.   Mini-Cog Screening Orientation to Time - Max 5 pts Orientation to Place - Max 5 pts Registration - Max 3 pts Recall - Max 3 pts Language Repeat - Max 1 pts Language Follow 3 Step Command - Max 3 pts     Immunization History  Administered Date(s) Administered  . Influenza-Unspecified 02/08/2016, 03/27/2017  . Pneumococcal Conjugate-13 09/03/2014  . Pneumococcal Polysaccharide-23 09/07/2015  . Zoster 10/23/2014    Screening Tests Health Maintenance  Topic Date Due  . TETANUS/TDAP  09/25/2017 (Originally [birth year]/1968)  . INFLUENZA VACCINE  12/27/2017  . COLONOSCOPY  05/03/2023  . Hepatitis C Screening   Completed  . PNA vac Low Risk Adult  Completed       Plan:     I have personally reviewed, addressed, and noted the following in the patient's chart:  A. Medical and social history B. Use of alcohol, tobacco or illicit drugs  C. Current medications and supplements D. Functional ability and status E.  Nutritional status F.  Physical activity G. Advance directives H. List of other physicians I.  Hospitalizations, surgeries, and ER visits in previous 12 months J.  [place] to include hearing, vision, cognitive, depression L. Referrals and appointments - none  In addition, I have reviewed and discussed with patient certain preventive protocols, quality metrics, and best practice recommendations. A written personalized care plan for preventive services as well as general preventive health recommendations were provided to patient.  See attached scanned questionnaire for additional information.   Signed,   Lindell Noe, MHA, BS, LPN Health Coach

## 2017-09-27 ENCOUNTER — Encounter: Payer: Medicare HMO | Admitting: Family Medicine

## 2017-10-01 ENCOUNTER — Ambulatory Visit (INDEPENDENT_AMBULATORY_CARE_PROVIDER_SITE_OTHER): Payer: Medicare HMO | Admitting: Family Medicine

## 2017-10-01 ENCOUNTER — Encounter: Payer: Self-pay | Admitting: Family Medicine

## 2017-10-01 VITALS — BP 120/80 | HR 68 | Temp 98.4°F | Ht 71.5 in | Wt 188.0 lb

## 2017-10-01 DIAGNOSIS — C61 Malignant neoplasm of prostate: Secondary | ICD-10-CM

## 2017-10-01 DIAGNOSIS — K625 Hemorrhage of anus and rectum: Secondary | ICD-10-CM

## 2017-10-01 DIAGNOSIS — M79646 Pain in unspecified finger(s): Secondary | ICD-10-CM

## 2017-10-01 DIAGNOSIS — Z7189 Other specified counseling: Secondary | ICD-10-CM

## 2017-10-01 DIAGNOSIS — Z0000 Encounter for general adult medical examination without abnormal findings: Secondary | ICD-10-CM

## 2017-10-01 MED ORDER — TAMSULOSIN HCL 0.4 MG PO CAPS: 0.4000 mg | ORAL_CAPSULE | Freq: Every day | ORAL | Status: DC

## 2017-10-01 NOTE — Assessment & Plan Note (Signed)
H/o likely rectal fissure and has used NTG prn with relief.  He had HA with higher amount of use.  He can tolerate lower dose of med prn.  Continue as is.  He agrees.

## 2017-10-01 NOTE — Assessment & Plan Note (Signed)
Per urology.  See above.

## 2017-10-01 NOTE — Patient Instructions (Signed)
Get back on the bike when you can- enjoy the rides.  Update me as needed. I'll await the notes from urology.  Thanks for your effort.  Take care.  Glad to see you.

## 2017-10-01 NOTE — Assessment & Plan Note (Signed)
He is working cycling when possible, when the weather is better.   He is going to get tetanus shot done at work.  I'll defer.   Other vaccines d/w pt.  He can get flu shot in the fall.   Normal colonoscopy 2014, d/w pt.   Living will d/w pt- his daughter would be designated if patient were incapacitated.  Labs d/w pt.   Never smoker.

## 2017-10-01 NOTE — Assessment & Plan Note (Signed)
Improved, see above.  Update me as needed.  He agrees.

## 2017-10-01 NOTE — Progress Notes (Signed)
His thumb pain is better and he is still playing bass some.  He tuned all the strings down by half step and that helped with his hand symptoms.  H/o likely rectal fissure and has used NTG prn with relief.  He had HA with higher amount of use.  He can tolerate lower dose of med prn.    H/o prostate cancer.  On flomax at baseline, down to 1 tab per day- he tolerates that, with relief.  He didn't tolerate stopping med.  He has f/u pending.  S/p seed implant.  FH prostate cancer noted, father at late age. I didn't order PSA, defer to urology.  Pt agrees.    He is working cycling when possible, when the weather is better.   He is going to get tetanus shot done at work.  I'll defer.   Other vaccines d/w pt.  He can get flu shot in the fall.   Normal colonoscopy 2014, d/w pt.   Living will d/w pt- his daughter would be designated if patient were incapacitated.  Labs d/w pt.   Never smoker.    PMH and SH reviewed  ROS: Per HPI unless specifically indicated in ROS section   Meds, vitals, and allergies reviewed.   GEN: nad, alert and oriented HEENT: mucous membranes moist NECK: supple w/o LA CV: rrr.  no murmur PULM: ctab, no inc wob ABD: soft, +bs EXT: no edema SKIN: no acute rash

## 2017-10-01 NOTE — Assessment & Plan Note (Signed)
Living will d/w pt- his daughter would be designated if patient were incapacitated.

## 2017-12-31 DIAGNOSIS — C61 Malignant neoplasm of prostate: Secondary | ICD-10-CM | POA: Diagnosis not present

## 2018-01-15 DIAGNOSIS — Z8546 Personal history of malignant neoplasm of prostate: Secondary | ICD-10-CM | POA: Diagnosis not present

## 2018-01-15 DIAGNOSIS — N400 Enlarged prostate without lower urinary tract symptoms: Secondary | ICD-10-CM | POA: Diagnosis not present

## 2018-07-02 DIAGNOSIS — C61 Malignant neoplasm of prostate: Secondary | ICD-10-CM | POA: Diagnosis not present

## 2018-07-09 DIAGNOSIS — C61 Malignant neoplasm of prostate: Secondary | ICD-10-CM | POA: Diagnosis not present

## 2018-07-09 DIAGNOSIS — N400 Enlarged prostate without lower urinary tract symptoms: Secondary | ICD-10-CM | POA: Diagnosis not present

## 2018-07-09 DIAGNOSIS — R351 Nocturia: Secondary | ICD-10-CM | POA: Diagnosis not present

## 2018-07-18 ENCOUNTER — Encounter: Payer: Self-pay | Admitting: Medical

## 2018-07-18 ENCOUNTER — Ambulatory Visit: Payer: Self-pay | Admitting: Medical

## 2018-07-18 VITALS — BP 146/77 | HR 58 | Temp 98.2°F | Resp 16 | Ht 72.0 in | Wt 196.0 lb

## 2018-07-18 DIAGNOSIS — J0100 Acute maxillary sinusitis, unspecified: Secondary | ICD-10-CM

## 2018-07-18 MED ORDER — AMOXICILLIN-POT CLAVULANATE 875-125 MG PO TABS: 1.0000 | ORAL_TABLET | Freq: Two times a day (BID) | ORAL | 0 refills | Status: DC

## 2018-07-18 NOTE — Progress Notes (Signed)
   Subjective:    Patient ID: Robert Perez, male    DOB: August 17, 1950, 68 y.o.   MRN: [id number]  HPI 68 yo male in non acute distress. Started with symptoms beginning of Jan 2020. Decreased ability of smell but states he can smell the dischrarge from  his nose and " it stinks". Crusting on the edge of nosesometimes.Denies colored discharge. Works at  [employer], in [employer].  Blood pressure (!) 146/77, pulse (!) 58, temperature 98.2 F (36.8 C), resp. rate 16, height 6' (1.829 m), weight 196 lb (88.9 kg), SpO2 100 %. Allergies  Allergen Reactions  . Codeine Nausea Only     Review of Systems  Constitutional: Negative for chills and fever.  HENT: Positive for congestion ( 2 weeks ago), postnasal drip (a little), rhinorrhea (on occasion), sinus pressure (maxillary) and sinus pain. Negative for ear discharge, ear pain, sneezing and sore throat.   Eyes: Negative for discharge and itching.  Respiratory: Negative for cough, chest tightness, shortness of breath and wheezing.   Cardiovascular: Negative for chest pain.  Gastrointestinal: Negative for abdominal pain.  Allergic/Immunologic: Positive for environmental allergies (spring). Negative for food allergies.  Neurological: Negative for dizziness, syncope, light-headedness and headaches.  Hematological: Negative for adenopathy.  Psychiatric/Behavioral: Negative for behavioral problems, confusion, self-injury and suicidal ideas.       Objective:   Physical Exam Vitals signs and nursing note reviewed.  Constitutional:      Appearance: Normal appearance.  HENT:     Head: Normocephalic and atraumatic.     Nose: Congestion and rhinorrhea present.     Mouth/Throat:     Mouth: Mucous membranes are moist.     Pharynx: Oropharynx is clear. No oropharyngeal exudate or posterior oropharyngeal erythema.     Comments: PND green in color Eyes:     Extraocular Movements: Extraocular movements intact.     Conjunctiva/sclera: Conjunctivae normal.    Pupils: Pupils are equal, round, and reactive to light.  Neck:     Musculoskeletal: Normal range of motion and neck supple.  Cardiovascular:     Rate and Rhythm: Normal rate and regular rhythm.  Pulmonary:     Effort: Pulmonary effort is normal.     Breath sounds: Normal breath sounds.  Musculoskeletal: Normal range of motion.  Skin:    General: Skin is warm and dry.  Neurological:     General: No focal deficit present.     Mental Status: He is alert and oriented to person, place, and time.  Psychiatric:        Mood and Affect: Mood normal.        Behavior: Behavior normal.        Thought Content: Thought content normal.        Judgment: Judgment normal.           Assessment & Plan:  Sinusitis maxillary Meds ordered this encounter  Medications  . amoxicillin-clavulanate (AUGMENTIN) 875-125 MG tablet    Sig: Take 1 tablet by mouth 2 (two) times daily.    Dispense:  20 tablet    Refill:  0  OTC Flonase as directed per package.  Follow up with your PCP if not improving in 3-5 days or if sensation of smell does not return. Patient verbalizes understanding and has no questions at discharge.

## 2018-07-18 NOTE — Patient Instructions (Signed)

## 2018-09-30 ENCOUNTER — Ambulatory Visit (INDEPENDENT_AMBULATORY_CARE_PROVIDER_SITE_OTHER): Payer: Medicare HMO

## 2018-09-30 DIAGNOSIS — Z0000 Encounter for general adult medical examination without abnormal findings: Secondary | ICD-10-CM

## 2018-09-30 NOTE — Progress Notes (Signed)
Subjective:   Robert Perez is a 68 y.o. male who presents for Medicare Annual (Subsequent) preventive examination.  Review of Systems:  N/A Cardiac Risk Factors include: advanced age (>77men, >105 women);male gender     Objective:     Vitals: There were no vitals taken for this visit.  There is no height or weight on file to calculate BMI.  Advanced Directives 09/20/2017 03/16/2017 01/08/2017 09/19/2016 09/07/2015  Does Patient Have a Medical Advance Directive? No No No Yes Yes  Type of Advance Directive - - Public librarian;Living will Living will;Healthcare Power of Attorney  Does patient want to make changes to medical advance directive? - - - - No - Patient declined  Copy of [place] in Chart? - - - No - copy requested No - copy requested  Would patient like information on creating a medical advance directive? Yes (MAU/Ambulatory/Procedural Areas - Information given) No - Patient declined No - Patient declined - -    Tobacco Social History   Tobacco Use  Smoking Status Never Smoker  Smokeless Tobacco Never Used     Counseling given: No   Clinical Intake:  Pre-visit preparation completed: Yes  Pain : No/denies pain Pain Score: 0-No pain     Nutritional Status: BMI 25 -29 Overweight Nutritional Risks: None Diabetes: No  How often do you need to have someone help you when you read instructions, pamphlets, or other written materials from your doctor or pharmacy?: 1 - Never  Interpreter Needed?: No  Information entered by :: LPinson, LPN  Past Medical History:  Diagnosis Date  . Allergic rhinitis due to pollen   . Prostate cancer [facility]) urologist-  dr ottelin/oncologist-  dr Tammi Klippel   dx 12-05-2016-- Stage T1c,  Gleason 3+4,  PSA 7.78,  vol 33cc  . PSA elevation   . Wears glasses    Past Surgical History:  Procedure Laterality Date  . COLONOSCOPY  2014  . CYSTOSCOPY  03/16/2017   Procedure: CYSTOSCOPY;  Surgeon: Kathie Rhodes, MD;  Location: [facility];  Service: Urology;;  no seeds found in bladder  . PROSTATE BIOPSY  12-05-2016   dr Karsten Ro office  . RADIOACTIVE SEED IMPLANT N/A 03/16/2017   Procedure: RADIOACTIVE SEED IMPLANT/BRACHYTHERAPY IMPLANT WITH SPACE OAR INSTILLATION;  Surgeon: Kathie Rhodes, MD;  Location: [facility];  Service: Urology;  Laterality: N/A;    74 seeds implanted   Family History  Problem Relation Age of Onset  . Diabetes Mother   . Arthritis Father   . Hyperlipidemia Father   . Hypertension Father   . Kidney disease Father   . Prostate cancer Father        dx'd late in life.   . Breast cancer Sister   . Colon cancer Maternal Grandfather   . Cancer Brother   . Bladder Cancer Brother   . Breast cancer Sister   . Breast cancer Sister   . Pancreatic cancer Neg Hx   . Stomach cancer Neg Hx    Social History   Socioeconomic History  . Marital status: Single    Spouse name: Not on file  . Number of children: Not on file  . Years of education: Not on file  . Highest education level: Not on file  Occupational History  . Not on file  Social Needs  . Financial resource strain: Not on file  . Food insecurity:    Worry: Not on file    Inability:  Not on file  . Transportation needs:    Medical: Not on file    Non-medical: Not on file  Tobacco Use  . Smoking status: Never Smoker  . Smokeless tobacco: Never Used  Substance and Sexual Activity  . Alcohol use: No    Alcohol/week: 0.0 standard drinks  . Drug use: No  . Sexual activity: Never  Lifestyle  . Physical activity:    Days per week: Not on file    Minutes per session: Not on file  . Stress: Not on file  Relationships  . Social connections:    Talks on phone: Not on file    Gets together: Not on file    Attends religious service: Not on file    Active member of club or organization: Not on file    Attends meetings of clubs or organizations: Not on file    Relationship status:  Not on file  Other Topics Concern  . Not on file  Social History Narrative   Lifelong single.   Has one adult Robert Perez.  Robert Perez is working in [place] for [employer].    [employer] since 2004. 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      Outpatient Encounter Medications as of 09/30/2018  Medication Sig  . tamsulosin (FLOMAX) 0.4 MG CAPS capsule Take 1 capsule (0.4 mg total) by mouth daily.  . [DISCONTINUED] amoxicillin-clavulanate (AUGMENTIN) 875-125 MG tablet Take 1 tablet by mouth 2 (two) times daily.  . [DISCONTINUED] nitroGLYCERIN (NITROGLYN) 2 % ointment Use a small amount rectally twice a day if needed.   No facility-administered encounter medications on file as of 09/30/2018.     Activities of Daily Living In your present state of health, do you have any difficulty performing the following activities: 09/30/2018  Hearing? N  Vision? N  Difficulty concentrating or making decisions? N  Walking or climbing stairs? N  Dressing or bathing? N  Doing errands, shopping? N  Preparing Food and eating ? N  Using the Toilet? N  In the past six months, have you accidently leaked urine? N  Do you have problems with loss of bowel control? N  Managing your Medications? N  Managing your Finances? N  Housekeeping or managing your Housekeeping? N  Some recent data might be hidden    Patient Care Team: Tonia Ghent, MD as PCP - General (Family Medicine)    Assessment:   This is a routine wellness examination for Robert Perez.  Vision Screening Comments: Vision exam approx. 2 yrs ago  Exercise Activities and Dietary recommendations Current Exercise Habits: Home exercise routine, Type of exercise: Other - see comments(cycling), Intensity: Moderate, Exercise limited by: None identified  Goals    . Patient Stated     Starting 09/30/2018, I will continue to take medications as  prescribed.        Fall Risk Fall Risk  09/30/2018 09/20/2017 01/08/2017 09/26/2016 09/19/2016  Falls in the past year? 0 No No No No  Number falls in past yr: - - - - -  Injury with Fall? - - - - -   Depression Screen PHQ 2/9 Scores 09/30/2018 09/20/2017 01/08/2017 09/26/2016  PHQ - 2 Score 0 0 0 0  PHQ- 9 Score 0 0 - -     Cognitive Function MMSE - Mini Mental State Exam 09/30/2018 09/20/2017 09/19/2016 09/07/2015  Orientation to time  5 5 5 5   Orientation to Place 5 5 5 5   Registration 3 3 3 3   Attention/ Calculation 0 0 0 0  Recall 3 3 3 3   Language- name 2 objects 0 0 0 0  Language- repeat 1 1 1 1   Language- follow 3 step command 0 3 3 3   Language- read & follow direction 0 0 0 0  Write a sentence 0 0 0 0  Copy design 0 0 0 0  Total score 17 20 20 20      PLEASE NOTE: A Mini-Cog screen was completed. Maximum score is 17. A value of 0 denotes this part of Folstein MMSE was not completed or the patient failed this part of the Mini-Cog screening.   Mini-Cog Screening Orientation to Time - Max 5 pts Orientation to Place - Max 5 pts Registration - Max 3 pts Recall - Max 3 pts Language Repeat - Max 1 pts      Immunization History  Administered Date(s) Administered  . Influenza,inj,Quad PF,6+ Mos 04/02/2018  . Influenza-Unspecified 02/08/2016, 03/27/2017, 04/02/2018  . Pneumococcal Conjugate-13 09/03/2014  . Pneumococcal Polysaccharide-23 09/07/2015  . Zoster 10/23/2014    Screening Tests Health Maintenance  Topic Date Due  . TETANUS/TDAP  01/12/1968  . INFLUENZA VACCINE  12/28/2018  . COLONOSCOPY  05/03/2023  . Hepatitis C Screening  Completed  . PNA vac Low Risk Adult  Completed    Plan:     I have personally reviewed, addressed, and noted the following in the patient's chart:  A. Medical and social history B. Use of alcohol, tobacco or illicit drugs  C. Current medications and supplements D. Functional ability and status E.  Nutritional status F.  Physical activity  G. Advance directives H. List of other physicians I.  Hospitalizations, surgeries, and ER visits in previous 12 months J.  Vitals (unless it is a telemedicine encounter) K. Screenings to include cognitive, depression, hearing, vision (NOTE: hearing and vision screenings not completed in telemedicine encounter) L. Referrals and appointments   In addition, I have reviewed and discussed with patient certain preventive protocols, quality metrics, and best practice recommendations. A written personalized care plan for preventive services and recommendations were provided to patient.  With patient's permission, we connected on 09/30/18 at  8:30 AM EDT by a video enabled telemedicine application. Two patient identifiers were used to ensure the encounter occurred with the correct person.    Patient was in home and writer was in office.   Signed,   Lindell Noe, MHA, BS, LPN Health Coach

## 2018-09-30 NOTE — Progress Notes (Signed)
PCP notes:   Health maintenance:  Tetanus - postponed/insurance  Abnormal screenings:   None  Patient concerns:   Constipation. Stool softeners have been minimally effective. - encouraged patient to increase fluid intake   Nurse concerns:  None  Next PCP appt:   10/10/18 @ 1415  I reviewed health advisor's note, was available for consultation on the day of service listed in this note, and agree with documentation and plan. Elsie Stain, MD.

## 2018-10-03 ENCOUNTER — Ambulatory Visit: Payer: Medicare HMO

## 2018-10-07 ENCOUNTER — Other Ambulatory Visit: Payer: Self-pay

## 2018-10-07 ENCOUNTER — Other Ambulatory Visit: Payer: Self-pay | Admitting: Family Medicine

## 2018-10-07 ENCOUNTER — Other Ambulatory Visit (INDEPENDENT_AMBULATORY_CARE_PROVIDER_SITE_OTHER): Payer: Medicare HMO

## 2018-10-07 DIAGNOSIS — E785 Hyperlipidemia, unspecified: Secondary | ICD-10-CM | POA: Diagnosis not present

## 2018-10-07 LAB — COMPREHENSIVE METABOLIC PANEL
ALT: 16 U/L (ref 0–53)
AST: 20 U/L (ref 0–37)
Albumin: 3.9 g/dL (ref 3.5–5.2)
Alkaline Phosphatase: 47 U/L (ref 39–117)
BUN: 19 mg/dL (ref 6–23)
CO2: 30 mEq/L (ref 19–32)
Calcium: 9.1 mg/dL (ref 8.4–10.5)
Chloride: 103 mEq/L (ref 96–112)
Creatinine, Ser: 1.03 mg/dL (ref 0.40–1.50)
GFR: 86.45 mL/min (ref >60.00)
Glucose, Bld: 102 mg/dL — ABNORMAL HIGH (ref 70–99)
Potassium: 4.1 mEq/L (ref 3.5–5.1)
Sodium: 139 mEq/L (ref 135–145)
Total Bilirubin: 0.3 mg/dL (ref 0.2–1.2)
Total Protein: 6.6 g/dL (ref 6.0–8.3)

## 2018-10-07 LAB — LIPID PANEL
Cholesterol: 216 mg/dL — ABNORMAL HIGH (ref 0–200)
HDL: 63.2 mg/dL (ref >39.00)
LDL Cholesterol: 140 mg/dL — ABNORMAL HIGH (ref 0–99)
NonHDL: 153.23
Total CHOL/HDL Ratio: 3
Triglycerides: 65 mg/dL (ref 0.0–149.0)
VLDL: 13 mg/dL (ref 0.0–40.0)

## 2018-10-10 ENCOUNTER — Encounter: Payer: Self-pay | Admitting: Family Medicine

## 2018-10-10 ENCOUNTER — Ambulatory Visit (INDEPENDENT_AMBULATORY_CARE_PROVIDER_SITE_OTHER): Payer: Medicare HMO | Admitting: Family Medicine

## 2018-10-10 VITALS — Ht 71.5 in | Wt 192.0 lb

## 2018-10-10 DIAGNOSIS — Z7189 Other specified counseling: Secondary | ICD-10-CM

## 2018-10-10 DIAGNOSIS — Z0000 Encounter for general adult medical examination without abnormal findings: Secondary | ICD-10-CM

## 2018-10-10 DIAGNOSIS — C61 Malignant neoplasm of prostate: Secondary | ICD-10-CM | POA: Diagnosis not present

## 2018-10-10 NOTE — Progress Notes (Signed)
Virtual visit completed through WebEx or similar program Patient location: home  Provider location: [place] at [facility], office   Limitations and rationale for visit method d/w patient.  Patient agreed to proceed.   CC: follow up.   HPI:  He is working cycling when possible, when the weather is better.  Tetanus shot d/w pt.  He can get that at work. Other vaccines d/w pt.  He can get flu shot in the fall.   Normal colonoscopy 2014, d/w pt.   Living will d/w pt- his daughter would be designated if patient were incapacitated.  Labs d/w pt.   Never smoker.    His fissure resolved.  He didn't tolerate NTG due to HA.   Cycled [employer] on Monday of this week. He is cycling a few times a week.    Constipation. Stool softeners have been minimally effective. He is back to baseline w/o troubles now.  Seen at outside clinic for sinus infection, tx'd with augmentin 875/125.  He had constipation with that med but then sx resolved after finishing med. No blood in stool.  And sinus sx have resolved.   Prostate cancer per urology.  Still on flomax, down to 1 tab a day.  PSA per urology.    Minimal inc in sugar.  Lipids reasonable.  D/w pt.    Pandemic consideration d/w pt.   PMH SH FH reviewed.   Meds and allergies reviewed.   ROS: Per HPI unless specifically indicated in ROS section   NAD Speech wnl  A/P:  Health maintenance He is working cycling when possible, when the weather is better.   Tetanus shot d/w pt.  He can get that at work. Other vaccines d/w pt.  He can get flu shot in the fall.   Normal colonoscopy 2014, d/w pt.   Living will d/w pt- his daughter would be designated if patient were incapacitated.  Labs d/w pt.   Never smoker.    His fissure resolved.  He didn't tolerate NTG due to HA.   Constipation.  Seem to be related to previous antibiotic use and back to baseline now.  Prostate cancer per urology.  Still on flomax, down to 1 tab a day.  PSA per urology.    I will defer.  He agrees.  I appreciate the help of all involved.

## 2018-10-13 NOTE — Assessment & Plan Note (Addendum)
Prostate cancer per urology.  Still on flomax, down to 1 tab a day.  No change in meds at this point.  PSA per urology.   I will defer.  He agrees.  I appreciate the help of all involved.

## 2018-10-13 NOTE — Assessment & Plan Note (Signed)
Living will d/w pt- his daughter would be designated if patient were incapacitated.

## 2018-10-13 NOTE — Assessment & Plan Note (Signed)
He is working cycling when possible, when the weather is better.   Tetanus shot d/w pt.  He can get that at work. Other vaccines d/w pt.  He can get flu shot in the fall.   Normal colonoscopy 2014, d/w pt.   Living will d/w pt- his daughter would be designated if patient were incapacitated.  Labs d/w pt.   Never smoker.

## 2019-01-02 DIAGNOSIS — C61 Malignant neoplasm of prostate: Secondary | ICD-10-CM | POA: Diagnosis not present

## 2019-01-09 DIAGNOSIS — Z8546 Personal history of malignant neoplasm of prostate: Secondary | ICD-10-CM | POA: Diagnosis not present

## 2019-03-14 ENCOUNTER — Other Ambulatory Visit: Payer: Self-pay

## 2019-03-14 ENCOUNTER — Ambulatory Visit: Payer: Medicare HMO

## 2019-03-14 DIAGNOSIS — Z23 Encounter for immunization: Secondary | ICD-10-CM

## 2019-04-21 IMAGING — DX DG CHEST 2V
2 series · 2 of 2 positions shown · non-contrast
Comparison: None.

CLINICAL DATA: Prostate carcinoma. Preoperative evaluation for seed
implant placement

EXAM:
CHEST  2 VIEW

[chest pa]
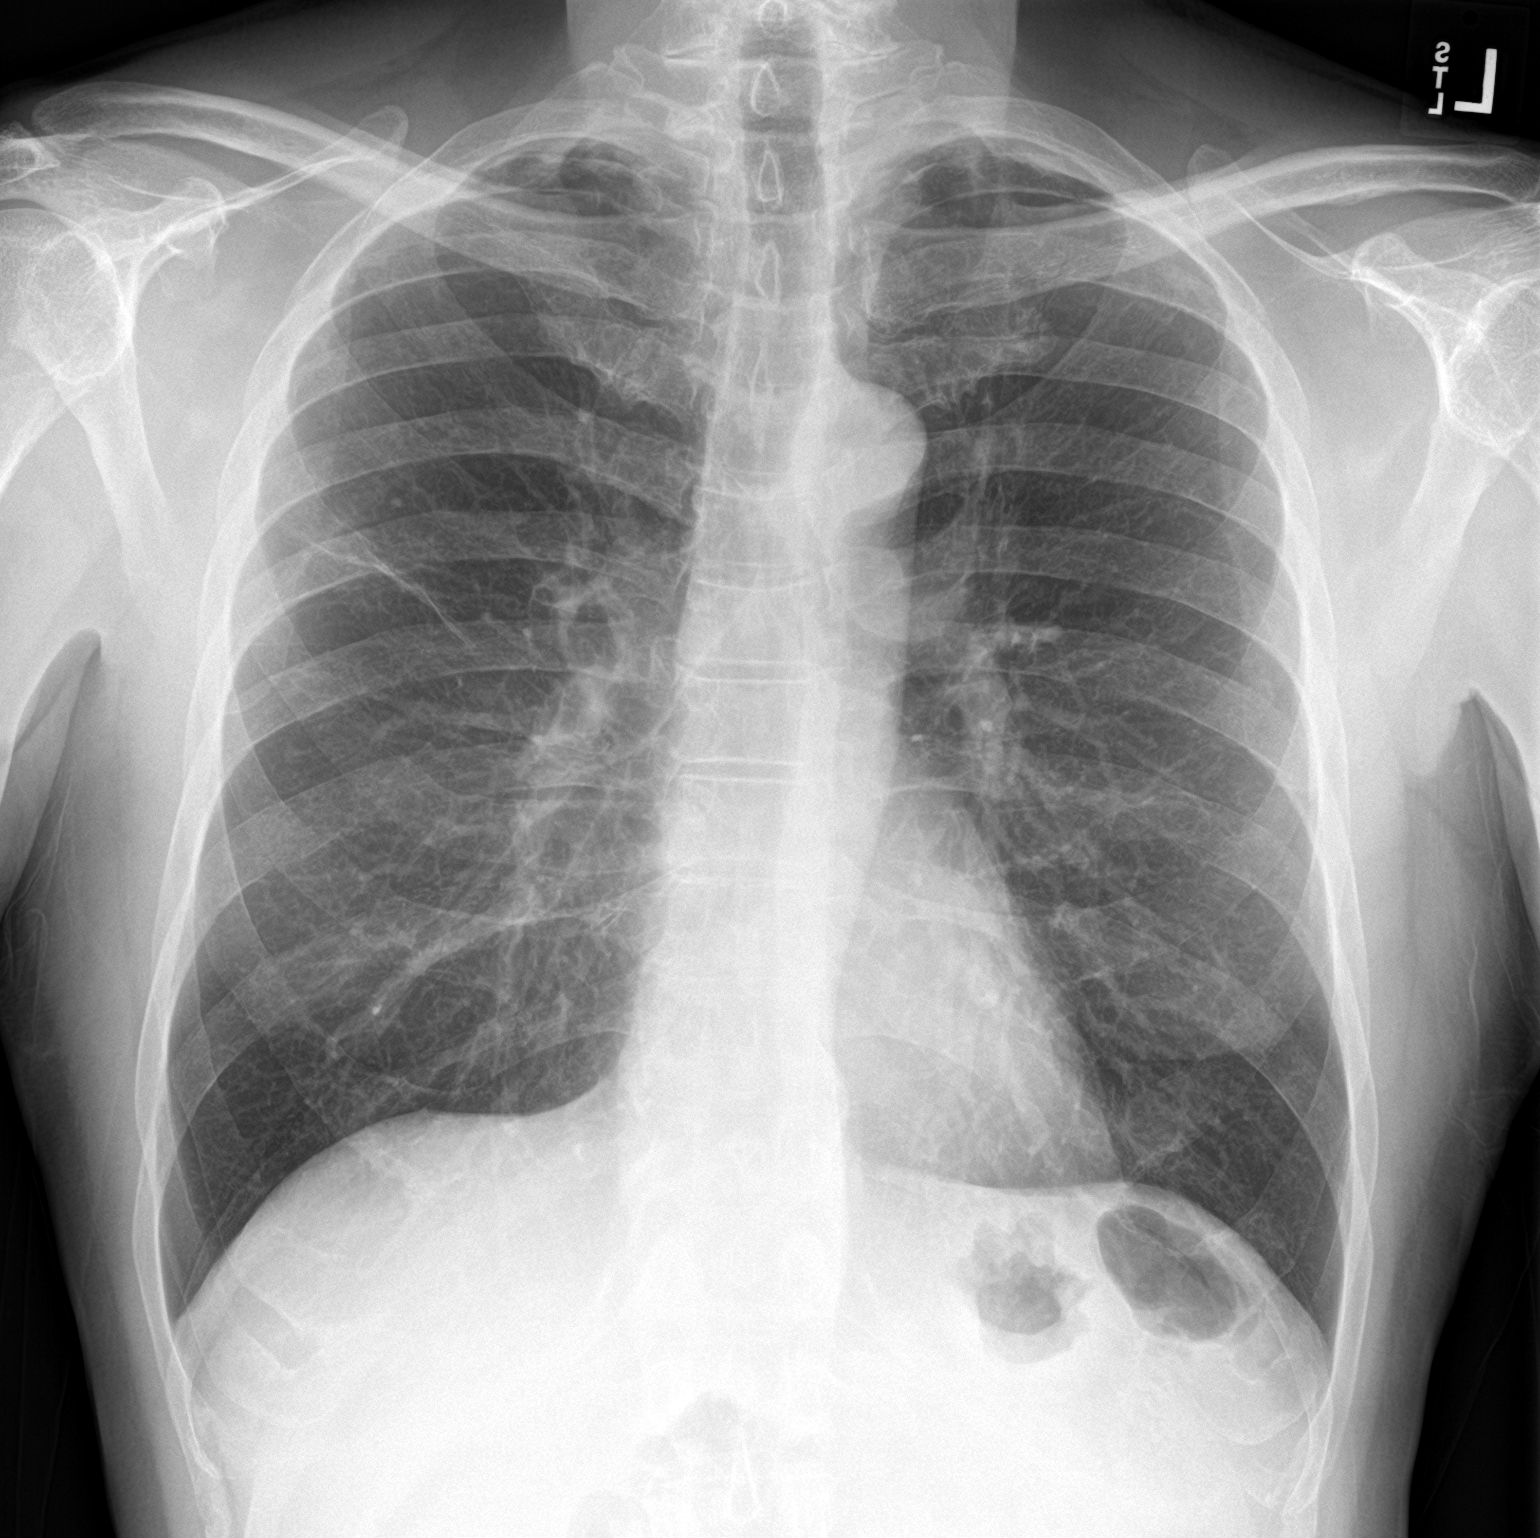

[chest lat]
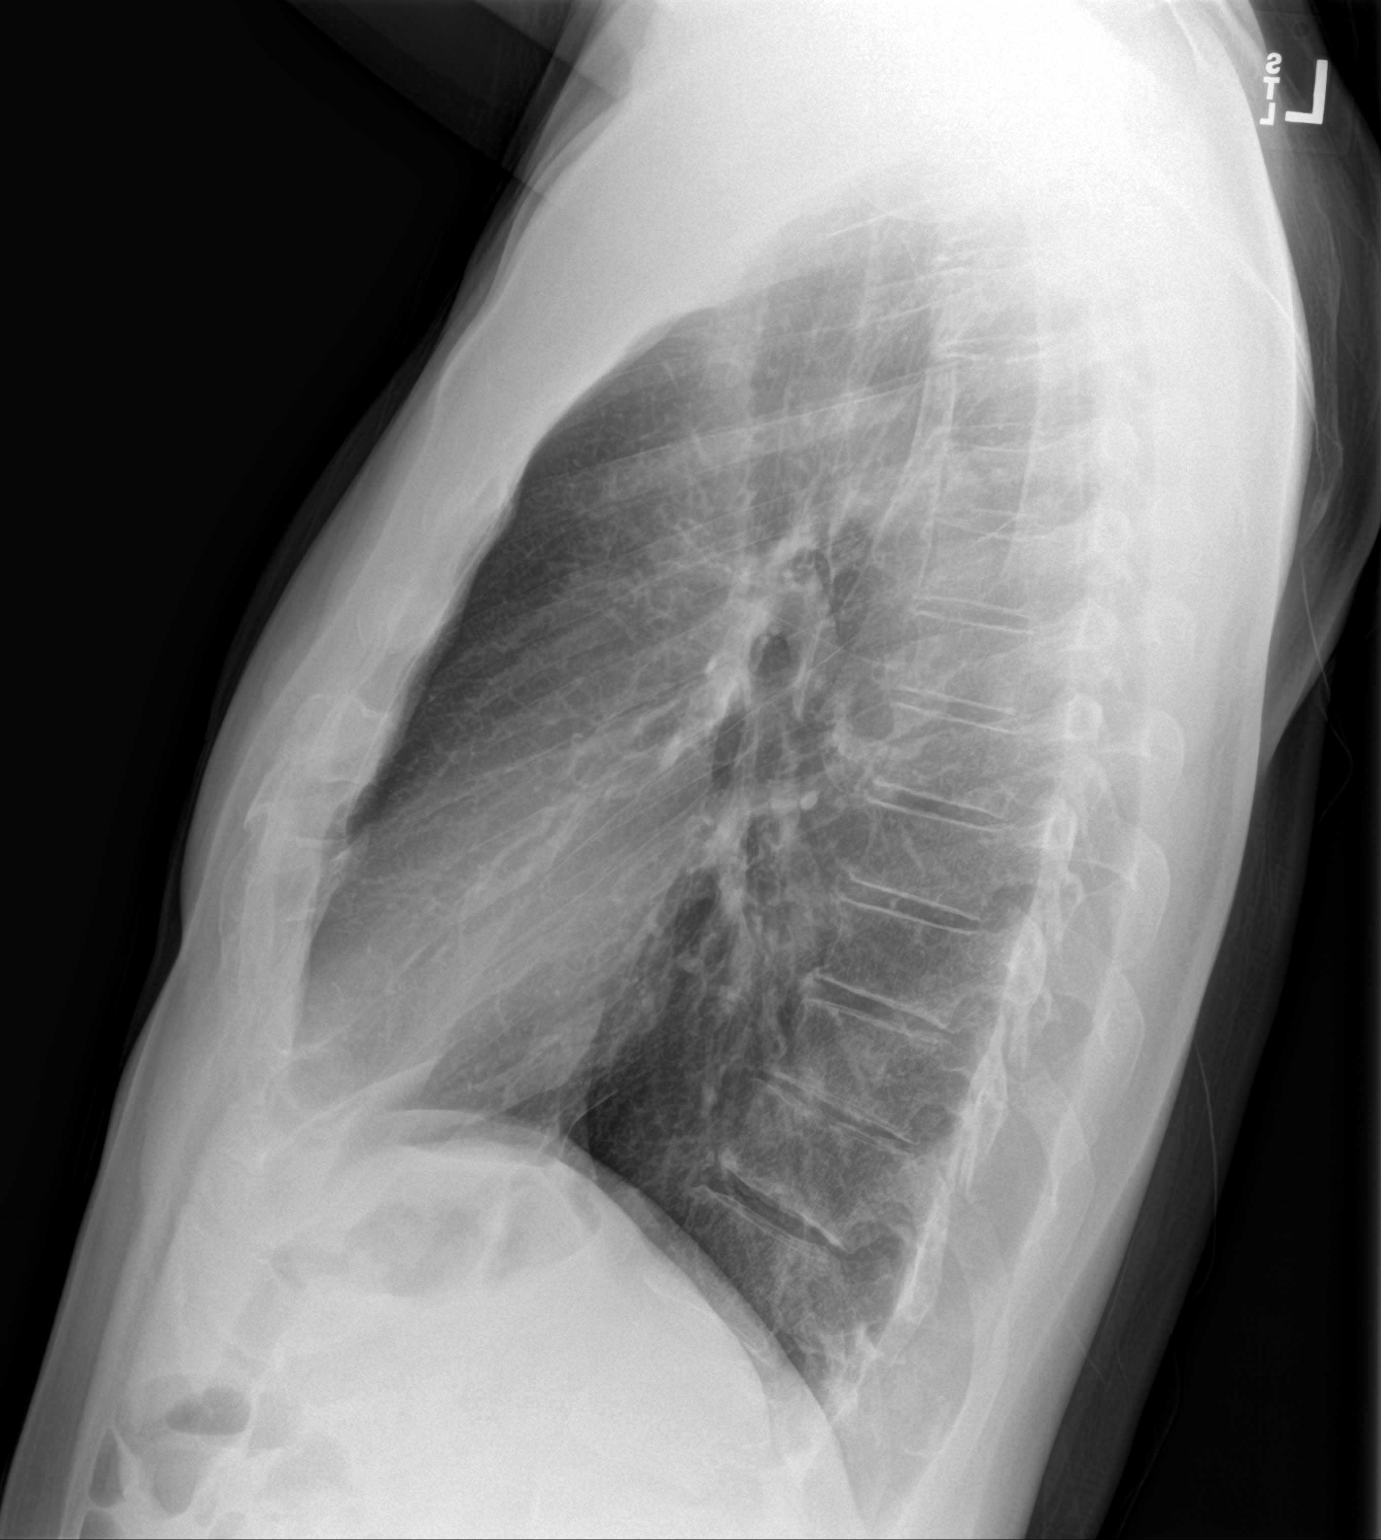

[2 of 2 positions shown; findings below may reference images not displayed]

FINDINGS: There is scarring in the right upper lobe. There is no edema or
consolidation. The heart size and pulmonary vascularity are normal.
No adenopathy. No blastic or lytic bone lesions are evident.
IMPRESSION: Scarring right upper lobe. Lungs elsewhere clear. No evident
adenopathy. No bone lesions evident.

## 2019-06-23 DIAGNOSIS — R972 Elevated prostate specific antigen [PSA]: Secondary | ICD-10-CM | POA: Diagnosis not present

## 2019-07-02 DIAGNOSIS — Z8546 Personal history of malignant neoplasm of prostate: Secondary | ICD-10-CM | POA: Diagnosis not present

## 2019-07-02 DIAGNOSIS — N5236 Erectile dysfunction following interstitial seed therapy: Secondary | ICD-10-CM | POA: Diagnosis not present

## 2019-07-20 ENCOUNTER — Ambulatory Visit: Payer: Medicare HMO | Attending: Internal Medicine

## 2019-07-20 DIAGNOSIS — Z23 Encounter for immunization: Secondary | ICD-10-CM | POA: Insufficient documentation

## 2019-07-20 NOTE — Progress Notes (Signed)
   Covid-19 Vaccination Clinic  Name:  Robert Perez    MRN: [id number] DOB: 10-Oct-1950  07/20/2019  [name] was observed post Covid-19 immunization for 15 minutes without incidence. He was provided with Vaccine Information Sheet and instruction to access the V-Safe system.   [name] was instructed to call 911 with any severe reactions post vaccine: Marland Kitchen Difficulty breathing  . Swelling of your face and throat  . A fast heartbeat  . A bad rash all over your body  . Dizziness and weakness    Immunizations Administered    Name Date Dose VIS Date Route   Pfizer COVID-19 Vaccine 07/20/2019 12:46 PM 0.3 mL 05/09/2019 Intramuscular   Manufacturer: [place]   Lot: [id number]   [place]: [id number]

## 2019-08-08 ENCOUNTER — Telehealth: Payer: Self-pay

## 2019-08-08 NOTE — Telephone Encounter (Signed)
Patient called stating that he has had pain flare up since 08/05/19 night time in his left shoulder, left side of his neck, left side clavicle area . Patient states during the day he feels better but around 8 or 9 pm when he settles down he notices sharp pain in these areas and it is painful to touch those areas. Some days notices some swelling in the shoulder/clavicle area. No recent trauma or injury. No rash, no SOB, no chest pain, no fever. Pain is not present with exertion. He has not tried to bike since this flare up on 08/05/19, he does say on 08/06/19 that morning he had hard time putting clothes on and shoes on from the pain.   I spoke with Dr Damita Dunnings and advised patient that this needs to be looked at and patient can go to urgent care over the weekend or come in next week for follow up on this as long as the pain is not dramatically worsening. Also patient can try to apply ice and heat to the areas. Patient was advised. Patient states today he feels the best than he has this week with the pain at least at this time. Patient will let us know Monday if he wants to be checked or will go to urgent care over the weekend if the pain gets worse or any other symptoms discussed above appear.

## 2019-08-08 NOTE — Telephone Encounter (Signed)
Noted. Thanks. Agreed.  

## 2019-08-12 ENCOUNTER — Ambulatory Visit: Payer: Medicare HMO | Attending: Internal Medicine

## 2019-08-12 DIAGNOSIS — Z23 Encounter for immunization: Secondary | ICD-10-CM

## 2019-08-12 NOTE — Progress Notes (Signed)
   Covid-19 Vaccination Clinic  Name:  Robert Perez    MRN: [id number] DOB: 09-28-1950  08/12/2019  Robert Perez was observed post Covid-19 immunization for 15 minutes without incident. He was provided with Vaccine Information Sheet and instruction to access the V-Safe system.   Robert Perez was instructed to call 911 with any severe reactions post vaccine: Marland Kitchen Difficulty breathing  . Swelling of face and throat  . A fast heartbeat  . A bad rash all over body  . Dizziness and weakness   Immunizations Administered    Name Date Dose VIS Date Route   Pfizer COVID-19 Vaccine 08/12/2019 12:31 PM 0.3 mL 05/09/2019 Intramuscular   Manufacturer: [place]   Lot: [id number]   [place]: [id number]

## 2019-09-05 DIAGNOSIS — R220 Localized swelling, mass and lump, head: Secondary | ICD-10-CM | POA: Diagnosis not present

## 2019-09-05 DIAGNOSIS — K047 Periapical abscess without sinus: Secondary | ICD-10-CM | POA: Diagnosis not present

## 2019-10-09 ENCOUNTER — Other Ambulatory Visit (INDEPENDENT_AMBULATORY_CARE_PROVIDER_SITE_OTHER): Payer: Medicare HMO

## 2019-10-09 ENCOUNTER — Other Ambulatory Visit: Payer: Self-pay

## 2019-10-09 ENCOUNTER — Other Ambulatory Visit: Payer: Self-pay | Admitting: Family Medicine

## 2019-10-09 DIAGNOSIS — E785 Hyperlipidemia, unspecified: Secondary | ICD-10-CM

## 2019-10-09 LAB — LIPID PANEL
Cholesterol: 212 mg/dL — ABNORMAL HIGH (ref 0–200)
HDL: 61.6 mg/dL (ref >39.00)
LDL Cholesterol: 137 mg/dL — ABNORMAL HIGH (ref 0–99)
NonHDL: 150.17
Total CHOL/HDL Ratio: 3
Triglycerides: 66 mg/dL (ref 0.0–149.0)
VLDL: 13.2 mg/dL (ref 0.0–40.0)

## 2019-10-09 LAB — COMPREHENSIVE METABOLIC PANEL
ALT: 14 U/L (ref 0–53)
AST: 15 U/L (ref 0–37)
Albumin: 4 g/dL (ref 3.5–5.2)
Alkaline Phosphatase: 44 U/L (ref 39–117)
BUN: 17 mg/dL (ref 6–23)
CO2: 31 mEq/L (ref 19–32)
Calcium: 9.2 mg/dL (ref 8.4–10.5)
Chloride: 103 mEq/L (ref 96–112)
Creatinine, Ser: 1.04 mg/dL (ref 0.40–1.50)
GFR: 85.25 mL/min (ref >60.00)
Glucose, Bld: 111 mg/dL — ABNORMAL HIGH (ref 70–99)
Potassium: 4.1 mEq/L (ref 3.5–5.1)
Sodium: 137 mEq/L (ref 135–145)
Total Bilirubin: 0.5 mg/dL (ref 0.2–1.2)
Total Protein: 6.5 g/dL (ref 6.0–8.3)

## 2019-10-10 ENCOUNTER — Ambulatory Visit (INDEPENDENT_AMBULATORY_CARE_PROVIDER_SITE_OTHER): Payer: Medicare HMO

## 2019-10-10 VITALS — Wt 190.0 lb

## 2019-10-10 DIAGNOSIS — Z0000 Encounter for general adult medical examination without abnormal findings: Secondary | ICD-10-CM

## 2019-10-10 NOTE — Patient Instructions (Signed)
Robert Perez , Thank you for taking time to come for your Medicare Wellness Visit. I appreciate your ongoing commitment to your health goals. Please review the following plan we discussed and let me know if I can assist you in the future.   Screening recommendations/referrals: Colonoscopy: Up to date, completed 05/02/2013 Recommended yearly ophthalmology/optometry visit for glaucoma screening and checkup Recommended yearly dental visit for hygiene and checkup  Vaccinations: Influenza vaccine: Up to date, completed 03/14/2019 Pneumococcal vaccine: Completed series Tdap vaccine: decline Shingles vaccine: discussed    Advanced directives: Advance directive discussed with you today. Even though you declined this today please call our office should you change your mind and we can give you the proper paperwork for you to fill out.  Conditions/risks identified: none  Next appointment: 10/17/2019 @ 2:15 pm   Preventive Care 65 Years and Older, Male Preventive care refers to lifestyle choices and visits with your health care provider that can promote health and wellness. What does preventive care include?  A yearly physical exam. This is also called an annual well check.  Dental exams once or twice a year.  Routine eye exams. Ask your health care provider how often you should have your eyes checked.  Personal lifestyle choices, including:  Daily care of your teeth and gums.  Regular physical activity.  Eating a healthy diet.  Avoiding tobacco and drug use.  Limiting alcohol use.  Practicing safe sex.  Taking low doses of aspirin every day.  Taking vitamin and mineral supplements as recommended by your health care provider. What happens during an annual well check? The services and screenings done by your health care provider during your annual well check will depend on your age, overall health, lifestyle risk factors, and family history of disease. Counseling  Your health care  provider may ask you questions about your:  Alcohol use.  Tobacco use.  Drug use.  Emotional well-being.  Home and relationship well-being.  Sexual activity.  Eating habits.  History of falls.  Memory and ability to understand (cognition).  Work and work Statistician. Screening  You may have the following tests or measurements:  Height, weight, and BMI.  Blood pressure.  Lipid and cholesterol levels. These may be checked every 5 years, or more frequently if you are over 56 years old.  Skin check.  Lung cancer screening. You may have this screening every year starting at age 66 if you have a 30-pack-year history of smoking and currently smoke or have quit within the past 15 years.  Fecal occult blood test (FOBT) of the stool. You may have this test every year starting at age 77.  Flexible sigmoidoscopy or colonoscopy. You may have a sigmoidoscopy every 5 years or a colonoscopy every 10 years starting at age 58.  Prostate cancer screening. Recommendations will vary depending on your family history and other risks.  Hepatitis C blood test.  Hepatitis B blood test.  Sexually transmitted disease (STD) testing.  Diabetes screening. This is done by checking your blood sugar (glucose) after you have not eaten for a while (fasting). You may have this done every 1-3 years.  Abdominal aortic aneurysm (AAA) screening. You may need this if you are a current or former smoker.  Osteoporosis. You may be screened starting at age 29 if you are at high risk. Talk with your health care provider about your test results, treatment options, and if necessary, the need for more tests. Vaccines  Your health care provider may recommend certain vaccines,  such as:  Influenza vaccine. This is recommended every year.  Tetanus, diphtheria, and acellular pertussis (Tdap, Td) vaccine. You may need a Td booster every 10 years.  Zoster vaccine. You may need this after age 41.  Pneumococcal  13-valent conjugate (PCV13) vaccine. One dose is recommended after age 14.  Pneumococcal polysaccharide (PPSV23) vaccine. One dose is recommended after age 11. Talk to your health care provider about which screenings and vaccines you need and how often you need them. This information is not intended to replace advice given to you by your health care provider. Make sure you discuss any questions you have with your health care provider. Document Released: 06/11/2015 Document Revised: 02/02/2016 Document Reviewed: 03/16/2015 Elsevier Interactive Patient Education  2017 [place] Prevention in the Home Falls can cause injuries. They can happen to people of all ages. There are many things you can do to make your home safe and to help prevent falls. What can I do on the outside of my home?  Regularly fix the edges of walkways and driveways and fix any cracks.  Remove anything that might make you trip as you walk through a door, such as a raised step or threshold.  Trim any bushes or trees on the path to your home.  Use bright outdoor lighting.  Clear any walking paths of anything that might make someone trip, such as rocks or tools.  Regularly check to see if handrails are loose or broken. Make sure that both sides of any steps have handrails.  Any raised decks and porches should have guardrails on the edges.  Have any leaves, snow, or ice cleared regularly.  Use sand or salt on walking paths during winter.  Clean up any spills in your garage right away. This includes oil or grease spills. What can I do in the bathroom?  Use night lights.  Install grab bars by the toilet and in the tub and shower. Do not use towel bars as grab bars.  Use non-skid mats or decals in the tub or shower.  If you need to sit down in the shower, use a plastic, non-slip stool.  Keep the floor dry. Clean up any water that spills on the floor as soon as it happens.  Remove soap buildup in the  tub or shower regularly.  Attach bath mats securely with double-sided non-slip rug tape.  Do not have throw rugs and other things on the floor that can make you trip. What can I do in the bedroom?  Use night lights.  Make sure that you have a light by your bed that is easy to reach.  Do not use any sheets or blankets that are too big for your bed. They should not hang down onto the floor.  Have a firm chair that has side arms. You can use this for support while you get dressed.  Do not have throw rugs and other things on the floor that can make you trip. What can I do in the kitchen?  Clean up any spills right away.  Avoid walking on wet floors.  Keep items that you use a lot in easy-to-reach places.  If you need to reach something above you, use a strong step stool that has a grab bar.  Keep electrical cords out of the way.  Do not use floor polish or wax that makes floors slippery. If you must use wax, use non-skid floor wax.  Do not have throw rugs and other things on  the floor that can make you trip. What can I do with my stairs?  Do not leave any items on the stairs.  Make sure that there are handrails on both sides of the stairs and use them. Fix handrails that are broken or loose. Make sure that handrails are as long as the stairways.  Check any carpeting to make sure that it is firmly attached to the stairs. Fix any carpet that is loose or worn.  Avoid having throw rugs at the top or bottom of the stairs. If you do have throw rugs, attach them to the floor with carpet tape.  Make sure that you have a light switch at the top of the stairs and the bottom of the stairs. If you do not have them, ask someone to add them for you. What else can I do to help prevent falls?  Wear shoes that:  Do not have high heels.  Have rubber bottoms.  Are comfortable and fit you well.  Are closed at the toe. Do not wear sandals.  If you use a stepladder:  Make sure that it  is fully opened. Do not climb a closed stepladder.  Make sure that both sides of the stepladder are locked into place.  Ask someone to hold it for you, if possible.  Clearly mark and make sure that you can see:  Any grab bars or handrails.  First and last steps.  Where the edge of each step is.  Use tools that help you move around (mobility aids) if they are needed. These include:  Canes.  Walkers.  Scooters.  Crutches.  Turn on the lights when you go into a dark area. Replace any light bulbs as soon as they burn out.  Set up your furniture so you have a clear path. Avoid moving your furniture around.  If any of your floors are uneven, fix them.  If there are any pets around you, be aware of where they are.  Review your medicines with your doctor. Some medicines can make you feel dizzy. This can increase your chance of falling. Ask your doctor what other things that you can do to help prevent falls. This information is not intended to replace advice given to you by your health care provider. Make sure you discuss any questions you have with your health care provider. Document Released: 03/11/2009 Document Revised: 10/21/2015 Document Reviewed: 06/19/2014 Elsevier Interactive Patient Education  2017 [employer].

## 2019-10-10 NOTE — Progress Notes (Signed)
PCP notes:  Health Maintenance: Tdap- insurance/financial   Abnormal Screenings: none   Patient concerns: Some sinus issues- dryness in nose, slight congestion in his head   Nurse concerns: none   Next PCP appt.: 10/17/2019 @ 2:15 pm

## 2019-10-10 NOTE — Progress Notes (Signed)
Subjective:   Robert Perez is a 69 y.o. male who presents for Medicare Annual/Subsequent preventive examination.  Review of Systems: N/A   I connected with the patient today by telephone and verified that I am speaking with the correct person using two identifiers. Location patient: home Location provider: work Persons participating in the virtual visit: patient, provider.   I discussed the limitations, risks, security and privacy concerns of performing an evaluation and management service by telephone and the availability of in person appointments. I also discussed with the patient that there may be a patient responsible charge related to this service. The patient expressed understanding and verbally consented to this telephonic visit.    Interactive audio and video telecommunications were attempted between this provider and patient, however failed, due to patient having technical difficulties OR patient did not have access to video capability.  We continued and completed visit with audio only.     Time Spent with patient on telephone encounter: 35 minutes  Cardiac Risk Factors include: advanced age (>62men, >29 women);male gender     Objective:    Vitals: Wt 190 lb (86.2 kg)   BMI 26.13 kg/m   Body mass index is 26.13 kg/m.  Advanced Directives 10/10/2019 09/30/2018 09/20/2017 03/16/2017 01/08/2017 09/19/2016 09/07/2015  Does Patient Have a Medical Advance Directive? No No No No No Yes Yes  Type of Advance Directive - - - - - Press photographer;Living will Living will;Healthcare Power of Attorney  Does patient want to make changes to medical advance directive? - - - - - - No - Patient declined  Copy of [place] in Chart? - - - - - No - copy requested No - copy requested  Would patient like information on creating a medical advance directive? No - Patient declined No - Patient declined Yes (MAU/Ambulatory/Procedural Areas - Information given) No - Patient  declined No - Patient declined - -    Tobacco Social History   Tobacco Use  Smoking Status Never Smoker  Smokeless Tobacco Never Used     Counseling given: Not Answered   Clinical Intake:  Pre-visit preparation completed: Yes  Pain : No/denies pain     Nutritional Risks: None Diabetes: No  How often do you need to have someone help you when you read instructions, pamphlets, or other written materials from your doctor or pharmacy?: 1 - Never What is the last grade level you completed in school?: 2 years of business college  Interpreter Needed?: No  Information entered by :: CJohnson, LPN  Past Medical History:  Diagnosis Date  . Allergic rhinitis due to pollen   . Prostate cancer [facility]) urologist-  dr ottelin/oncologist-  dr Tammi Klippel   dx 12-05-2016-- Stage T1c,  Gleason 3+4,  PSA 7.78,  vol 33cc  . PSA elevation   . Wears glasses    Past Surgical History:  Procedure Laterality Date  . COLONOSCOPY  2014  . CYSTOSCOPY  03/16/2017   Procedure: CYSTOSCOPY;  Surgeon: Kathie Rhodes, MD;  Location: [facility];  Service: Urology;;  no seeds found in bladder  . PROSTATE BIOPSY  12-05-2016   dr Karsten Ro office  . RADIOACTIVE SEED IMPLANT N/A 03/16/2017   Procedure: RADIOACTIVE SEED IMPLANT/BRACHYTHERAPY IMPLANT WITH SPACE OAR INSTILLATION;  Surgeon: Kathie Rhodes, MD;  [facility];  Service: Urology;  Laterality: N/A;    82 seeds implanted   Family History  Problem Relation Age of Onset  . Diabetes Mother   .  Arthritis Father   . Hyperlipidemia Father   . Hypertension Father   . Kidney disease Father   . Prostate cancer Father        dx'd late in life.   . Breast cancer Sister   . Colon cancer Maternal Grandfather   . Cancer Brother   . Bladder Cancer Brother   . Breast cancer Sister   . Breast cancer Sister   . Pancreatic cancer Neg Hx   . Stomach cancer Neg Hx    Social History   Socioeconomic History  . Marital  status: Single    Spouse name: Not on file  . Number of children: Not on file  . Years of education: Not on file  . Highest education level: Not on file  Occupational History  . Not on file  Tobacco Use  . Smoking status: Never Smoker  . Smokeless tobacco: Never Used  Substance and Sexual Activity  . Alcohol use: No    Alcohol/week: 0.0 standard drinks  . Drug use: No  . Sexual activity: Never  Other Topics Concern  . Not on file  Social History Narrative   Lifelong single.   Has one adult daughter.  Daughter is working in [place] for [employer].    [place] since 2004. 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     Social Determinants of Health   Financial Resource Strain: Low Risk   . Difficulty of Paying Living Expenses: Not hard at all  Food Insecurity: No Food Insecurity  . Worried About Charity fundraiser in the Last Year: Never true  . Ran Out of Food in the Last Year: Never true  Transportation Needs: No Transportation Needs  . Lack of Transportation (Medical): No  . Lack of Transportation (Non-Medical): No  Physical Activity: Sufficiently Active  . Days of Exercise per Week: 7 days  . Minutes of Exercise per Session: 60 min  Stress: No Stress Concern Present  . Feeling of Stress : Not at all  Social Connections:   . Frequency of Communication with Friends and Family:   . Frequency of Social Gatherings with Friends and Family:   . Attends Religious Services:   . Active Member of Clubs or Organizations:   . Attends Archivist Meetings:   Marland Kitchen Marital Status:     Outpatient Encounter Medications as of 10/10/2019  Medication Sig  . tamsulosin (FLOMAX) 0.4 MG CAPS capsule Take 1 capsule (0.4 mg total) by mouth daily.   No facility-administered encounter medications on file as of 10/10/2019.    Activities of Daily Living In your present  state of health, do you have any difficulty performing the following activities: 10/10/2019  Hearing? N  Vision? N  Difficulty concentrating or making decisions? N  Walking or climbing stairs? N  Dressing or bathing? N  Doing errands, shopping? N  Preparing Food and eating ? N  Using the Toilet? N  In the past six months, have you accidently leaked urine? N  Do you have problems with loss of bowel control? N  Managing your Medications? N  Managing your Finances? N  Housekeeping or managing your Housekeeping? N  Some recent data might be hidden    Patient Care Team: Tonia Ghent, MD as PCP - General (Family Medicine)   Assessment:   This is a routine wellness examination for  Gorman.  Exercise Activities and Dietary recommendations Current Exercise Habits: Home exercise routine, Type of exercise: Other - see comments(cycling), Time (Minutes): 60, Frequency (Times/Week): 7, Weekly Exercise (Minutes/Week): 420, Intensity: Moderate, Exercise limited by: None identified  Goals    . Patient Stated     Starting 09/30/2018, I will continue to take medications as prescribed.     . Patient Stated     10/10/2019, I will continue to cycle 60-70 [place] a week.       Fall Risk Fall Risk  10/10/2019 09/30/2018 09/20/2017 01/08/2017 09/26/2016  Falls in the past year? 0 0 No No No  Number falls in past yr: 0 - - - -  Injury with Fall? 0 - - - -  Risk for fall due to : No Fall Risks - - - -  Follow up Falls evaluation completed;Falls prevention discussed - - - -   Is the patient's home free of loose throw rugs in walkways, pet beds, electrical cords, etc?   yes      Grab bars in the bathroom? yes      Handrails on the stairs?   yes      Adequate lighting?   yes  Timed Get Up and Go Performed: N/A  Depression Screen PHQ 2/9 Scores 10/10/2019 09/30/2018 09/20/2017 01/08/2017  PHQ - 2 Score 0 0 0 0  PHQ- 9 Score 0 0 0 -    Cognitive Function MMSE - Mini Mental State Exam 10/10/2019 09/30/2018  09/20/2017 09/19/2016 09/07/2015  Orientation to time 5 5 5 5 5   Orientation to Place 5 5 5 5 5   Registration 3 3 3 3 3   Attention/ Calculation 5 0 0 0 0  Recall 3 3 3 3 3   Language- name 2 objects - 0 0 0 0  Language- repeat 1 1 1 1 1   Language- follow 3 step command - 0 3 3 3   Language- read & follow direction - 0 0 0 0  Write a sentence - 0 0 0 0  Copy design - 0 0 0 0  Total score - 17 20 20 20   Mini Cog  Mini-Cog screen was completed. Maximum score is 22. A value of 0 denotes this part of the MMSE was not completed or the patient failed this part of the Mini-Cog screening.        Immunization History  Administered Date(s) Administered  . Influenza,inj,Quad PF,6+ Mos 04/02/2018, 03/14/2019  . Influenza-Unspecified 02/08/2016, 03/27/2017, 04/02/2018  . PFIZER SARS-COV-2 Vaccination 07/20/2019, 08/12/2019  . Pneumococcal Conjugate-13 09/03/2014  . Pneumococcal Polysaccharide-23 09/07/2015  . Zoster 10/23/2014    Qualifies for Shingles Vaccine: Yes  Screening Tests Health Maintenance  Topic Date Due  . TETANUS/TDAP  05/28/2020 (Originally 01/12/1970)  . INFLUENZA VACCINE  12/28/2019  . COLONOSCOPY  05/03/2023  . COVID-19 Vaccine  Completed  . Hepatitis C Screening  Completed  . PNA vac Low Risk Adult  Completed   Cancer Screenings: Lung: Low Dose CT Chest recommended if Age 12-80 years, 30 pack-year currently smoking OR have quit w/in 15 years. Patient does not qualify. Colorectal: completed 05/02/2013  Additional Screenings:  Hepatitis C Screening: 09/07/2015      Plan:   Patient will continue to cycle 60-70 [place] per week.   I have personally reviewed and noted the following in the patient's chart:   . Medical and social history . Use of alcohol, tobacco or illicit drugs  . Current medications and supplements . Functional ability and status .  Nutritional status . Physical activity . Advanced directives . List of other physicians . Hospitalizations,  surgeries, and ER visits in previous 12 months . Vitals . Screenings to include cognitive, depression, and falls . Referrals and appointments  In addition, I have reviewed and discussed with patient certain preventive protocols, quality metrics, and best practice recommendations. A written personalized care plan for preventive services as well as general preventive health recommendations were provided to patient.     Marla, [place]holt, LPN  [id number]

## 2019-10-17 ENCOUNTER — Ambulatory Visit: Payer: Medicare HMO | Admitting: Family Medicine

## 2019-10-21 ENCOUNTER — Encounter: Payer: Self-pay | Admitting: Family Medicine

## 2019-10-21 ENCOUNTER — Other Ambulatory Visit: Payer: Self-pay

## 2019-10-21 ENCOUNTER — Ambulatory Visit (INDEPENDENT_AMBULATORY_CARE_PROVIDER_SITE_OTHER): Payer: Medicare HMO | Admitting: Family Medicine

## 2019-10-21 VITALS — BP 128/76 | HR 57 | Temp 97.8°F | Ht 71.75 in | Wt 196.0 lb

## 2019-10-21 DIAGNOSIS — Z7189 Other specified counseling: Secondary | ICD-10-CM

## 2019-10-21 DIAGNOSIS — Z0000 Encounter for general adult medical examination without abnormal findings: Secondary | ICD-10-CM

## 2019-10-21 DIAGNOSIS — C61 Malignant neoplasm of prostate: Secondary | ICD-10-CM | POA: Diagnosis not present

## 2019-10-21 NOTE — Patient Instructions (Addendum)
Check with your insurance to see if they will cover the shingrix shot. Update me as needed.  Take care.  Glad to see you. Thanks for your effort.

## 2019-10-21 NOTE — Progress Notes (Signed)
This visit occurred during the SARS-CoV-2 public health emergency.  Safety protocols were in place, including screening questions prior to the visit, additional usage of staff PPE, and extensive cleaning of exam room while observing appropriate contact time as indicated for disinfecting solutions.  He is still cycling.  38 [place] this past Saturday with a group. Usually 20 [place] twice a week and ~40 [place] once a week.  ~80 [place] per week.   He is still playing bass and working part time.    Still on flomax, 1 tab a day.  Doing well with that.  No ADE on med. He has urology f/u pending.  I'll defer to urology, pt agrees.    covid vaccine 2021 Tetanus shot d/w pt.  He can get that at work. PNA vaccine UTD.   shingrix d/w pt.  Normal colonoscopy 2014, d/w pt.  PSA with urology.   Living will d/w pt- his daughter would be designated if patient were incapacitated.   Some sinus congestion.  He is better in the meantime, irritation is better in the meantime with nasal saline.    No FCNAVD.    PMH and SH reviewed  ROS: Per HPI unless specifically indicated in ROS section   Meds, vitals, and allergies reviewed.   GEN: nad, alert and oriented HEENT: ncat NECK: supple w/o LA CV: rrr.  no murmur PULM: ctab, no inc wob ABD: soft, +bs EXT: no edema SKIN: no acute rash but he has a benign appearing skin tag on the left chest wall.

## 2019-10-22 DIAGNOSIS — C61 Malignant neoplasm of prostate: Secondary | ICD-10-CM | POA: Insufficient documentation

## 2019-10-22 NOTE — Assessment & Plan Note (Signed)
Living will d/w pt- his daughter would be designated if patient were incapacitated.

## 2019-10-22 NOTE — Assessment & Plan Note (Signed)
covid vaccine 2021 Tetanus shot d/w pt.  He can get that at work. PNA vaccine UTD.   shingrix d/w pt.  Normal colonoscopy 2014, d/w pt.  PSA with urology.   Living will d/w pt- his daughter would be designated if patient were incapacitated.

## 2019-10-22 NOTE — Assessment & Plan Note (Signed)
Still on flomax, 1 tab a day.  Doing well with that.  No ADE on med. He has urology f/u pending.  I'll defer to urology, pt agrees.   Continue Flomax as is for now.  He will update me as needed.

## 2020-03-01 ENCOUNTER — Ambulatory Visit: Payer: Medicare HMO | Attending: Internal Medicine

## 2020-03-01 DIAGNOSIS — Z23 Encounter for immunization: Secondary | ICD-10-CM

## 2020-03-01 NOTE — Progress Notes (Signed)
   Covid-19 Vaccination Clinic  Name:  Robert Perez    MRN: [id number] DOB: 19-May-1951  03/01/2020  [name] was observed post Covid-19 immunization for 15 minutes without incident. He was provided with Vaccine Information Sheet and instruction to access the V-Safe system.   [name] was instructed to call 911 with any severe reactions post vaccine: Marland Kitchen Difficulty breathing  . Swelling of face and throat  . A fast heartbeat  . A bad rash all over body  . Dizziness and weakness

## 2020-06-18 ENCOUNTER — Telehealth: Payer: Self-pay | Admitting: Family Medicine

## 2020-06-18 NOTE — Telephone Encounter (Signed)
Is this something you can do or do you need him to make an appt to have done first?

## 2020-06-18 NOTE — Telephone Encounter (Signed)
Let me check the chart and we'll go from there.  Thanks.

## 2020-06-18 NOTE — Telephone Encounter (Signed)
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 Patient states that the insurance company for the church is requesting a letter stating that you are physically and mentally health to drive the Lucianne Lei for [employer]. Please advise. Em

## 2020-06-20 NOTE — Telephone Encounter (Signed)
Please verify with patient.  When we talked last year he was still cycling multiple days per week, working part-time, and playing bass.  If he is still able to do all that and if he has not had any other significant clinical changes in the meantime, and assuming he does not have any contraindication to driving due to vision, thought, or coordination, then I think it makes sense for him to continue driving.  If he can verify his functional status as described above, then let me know and I can work on the letter.  Thanks.

## 2020-06-23 NOTE — Telephone Encounter (Signed)
Spoke with patient and he has still been doing everything below the same. Has not had any problems with vision, thoughts or coordination. I advised patient once letter is done we would let him know.

## 2020-06-23 NOTE — Telephone Encounter (Signed)
Letter done.  Please make sure it printed.  Please send to patient after I sign it.  Thanks.

## 2020-06-25 NOTE — Telephone Encounter (Signed)
Letter mailed to patient and tried to call to let him know but no answer and VM not set up

## 2020-06-29 DIAGNOSIS — C61 Malignant neoplasm of prostate: Secondary | ICD-10-CM | POA: Diagnosis not present

## 2020-07-06 DIAGNOSIS — Z8546 Personal history of malignant neoplasm of prostate: Secondary | ICD-10-CM | POA: Diagnosis not present

## 2020-07-06 DIAGNOSIS — N5236 Erectile dysfunction following interstitial seed therapy: Secondary | ICD-10-CM | POA: Diagnosis not present

## 2020-07-06 DIAGNOSIS — R350 Frequency of micturition: Secondary | ICD-10-CM | POA: Diagnosis not present

## 2020-07-06 DIAGNOSIS — R351 Nocturia: Secondary | ICD-10-CM | POA: Diagnosis not present

## 2020-10-12 ENCOUNTER — Other Ambulatory Visit: Payer: Self-pay | Admitting: Family Medicine

## 2020-10-12 ENCOUNTER — Ambulatory Visit (INDEPENDENT_AMBULATORY_CARE_PROVIDER_SITE_OTHER): Payer: Medicare HMO

## 2020-10-12 ENCOUNTER — Other Ambulatory Visit: Payer: Self-pay

## 2020-10-12 DIAGNOSIS — E785 Hyperlipidemia, unspecified: Secondary | ICD-10-CM

## 2020-10-12 DIAGNOSIS — Z0000 Encounter for general adult medical examination without abnormal findings: Secondary | ICD-10-CM | POA: Diagnosis not present

## 2020-10-12 NOTE — Progress Notes (Signed)
Subjective:   Robert Perez is a 70 y.o. male who presents for Medicare Annual/Subsequent preventive examination.  Review of Systems: N/A     I connected with the patient today by telephone and verified that I am speaking with the correct person using two identifiers. Location patient: home Location nurse: work Persons participating in the telephone visit: patient, nurse.   I discussed the limitations, risks, security and privacy concerns of performing an evaluation and management service by telephone and the availability of in person appointments. I also discussed with the patient that there may be a patient responsible charge related to this service. The patient expressed understanding and verbally consented to this telephonic visit.        Cardiac Risk Factors include: advanced age (>35men, >56 women);male gender     Objective:    Today's Vitals   There is no height or weight on file to calculate BMI.  Advanced Directives 10/12/2020 10/10/2019 09/30/2018 09/20/2017 03/16/2017 01/08/2017 09/19/2016  Does Patient Have a Medical Advance Directive? No No No No No No Yes  Type of Advance Directive - - - - - - Press photographer;Living will  Does patient want to make changes to medical advance directive? - - - - - - -  Copy of [place] in Chart? - - - - - - No - copy requested  Would patient like information on creating a medical advance directive? No - Patient declined No - Patient declined No - Patient declined Yes (MAU/Ambulatory/Procedural Areas - Information given) No - Patient declined No - Patient declined -    Current Medications (verified) Outpatient Encounter Medications as of 10/12/2020  Medication Sig  . tamsulosin (FLOMAX) 0.4 MG CAPS capsule Take 1 capsule (0.4 mg total) by mouth daily.   No facility-administered encounter medications on file as of 10/12/2020.    Allergies (verified) Codeine and Nitroglycerin   History: Past Medical  History:  Diagnosis Date  . Allergic rhinitis due to pollen   . Prostate cancer [facility]) urologist-  dr ottelin/oncologist-  dr Tammi Klippel   dx 12-05-2016-- Stage T1c,  Gleason 3+4,  PSA 7.78,  vol 33cc  . PSA elevation   . Wears glasses    Past Surgical History:  Procedure Laterality Date  . COLONOSCOPY  2014  . CYSTOSCOPY  03/16/2017   Procedure: CYSTOSCOPY;  Surgeon: Kathie Rhodes, MD;  Location: [facility];  Service: Urology;;  no seeds found in bladder  . PROSTATE BIOPSY  12-05-2016   dr Karsten Ro office  . RADIOACTIVE SEED IMPLANT N/A 03/16/2017   Procedure: RADIOACTIVE SEED IMPLANT/BRACHYTHERAPY IMPLANT WITH SPACE OAR INSTILLATION;  Surgeon: Kathie Rhodes, MD;  Location: [facility];  Service: Urology;  Laterality: N/A;    20 seeds implanted   Family History  Problem Relation Age of Onset  . Diabetes Mother   . Arthritis Father   . Hyperlipidemia Father   . Hypertension Father   . Kidney disease Father   . Prostate cancer Father        dx'd late in life.   . Breast cancer Sister   . Colon cancer Maternal Grandfather   . Cancer Brother   . Bladder Cancer Brother   . Breast cancer Sister   . Breast cancer Sister   . Pancreatic cancer Neg Hx   . Stomach cancer Neg Hx    Social History   Socioeconomic History  . Marital status: Single    Spouse name: Not on file  .  Number of children: Not on file  . Years of education: Not on file  . Highest education level: Not on file  Occupational History  . Not on file  Tobacco Use  . Smoking status: Never Smoker  . Smokeless tobacco: Never Used  Vaping Use  . Vaping Use: Never used  Substance and Sexual Activity  . Alcohol use: No    Alcohol/week: 0.0 standard drinks  . Drug use: No  . Sexual activity: Never  Other Topics Concern  . Not on file  Social History Narrative   Lifelong single.   Has one adult daughter.  Daughter is working in [place] for [employer].    [place] since 2004.  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     Social Determinants of Health   Financial Resource Strain: Low Risk   . Difficulty of Paying Living Expenses: Not hard at all  Food Insecurity: No Food Insecurity  . Worried About Charity fundraiser in the Last Year: Never true  . Ran Out of Food in the Last Year: Never true  Transportation Needs: No Transportation Needs  . Lack of Transportation (Medical): No  . Lack of Transportation (Non-Medical): No  Physical Activity: Sufficiently Active  . Days of Exercise per Week: 4 days  . Minutes of Exercise per Session: 150+ min  Stress: No Stress Concern Present  . Feeling of Stress : Not at all  Social Connections: Not on file    Tobacco Counseling Counseling given: Not Answered   Clinical Intake:  Pre-visit preparation completed: Yes  Pain : No/denies pain     Nutritional Risks: None Diabetes: No  How often do you need to have someone help you when you read instructions, pamphlets, or other written materials from your doctor or pharmacy?: 1 - Never  Diabetic: No Nutrition Risk Assessment:  Has the patient had any N/V/D within the last 2 months?  No  Does the patient have any non-healing wounds?  No  Has the patient had any unintentional weight loss or weight gain?  No   Diabetes:  Is the patient diabetic?  No  If diabetic, was a CBG obtained today?  N/A Did the patient bring in their glucometer from home?  N/A How often do you monitor your CBG's? N/A.   Financial Strains and Diabetes Management:  Are you having any financial strains with the device, your supplies or your medication? N/A.  Does the patient want to be seen by Chronic Care Management for management of their diabetes?  N/A Would the patient like to be referred to a Nutritionist or for Diabetic Management?  N/A  Interpreter Needed?:  No  Information entered by :: CJohnson, LPN   Activities of Daily Living In your present state of health, do you have any difficulty performing the following activities: 10/12/2020  Hearing? N  Vision? N  Difficulty concentrating or making decisions? N  Walking or climbing stairs? N  Dressing or bathing? N  Doing errands, shopping? N  Preparing Food and eating ? N  Using the Toilet? N  In the past six months, have you accidently leaked urine? N  Do you have problems with loss of bowel control? N  Managing your Medications? N  Managing your Finances? N  Housekeeping or managing your Housekeeping? N  Some recent data might be hidden    Patient  Care Team: Tonia Ghent, MD as PCP - General (Family Medicine)  Indicate any recent Medical Services you may have received from other than [facility] providers in the past year (date may be approximate).     Assessment:   This is a routine wellness examination for [place].  Hearing/Vision screen  Hearing Screening   125Hz  250Hz  500Hz  1000Hz  2000Hz  3000Hz  4000Hz  6000Hz  8000Hz   Right ear:           Left ear:           Vision Screening Comments: Patient gets annual eye exams   Dietary issues and exercise activities discussed: Current Exercise Habits: Home exercise routine, Type of exercise: Other - see comments (cycling), Time (Minutes): > 60, Frequency (Times/Week): 4, Weekly Exercise (Minutes/Week): 0, Intensity: Intense, Exercise limited by: None identified  Goals Addressed            This Visit's Progress   . Patient Stated       10/12/2020, I will continue to cycle 4 days a week for 15 [place]- [address] [place].      Depression Screen PHQ 2/9 Scores 10/12/2020 10/10/2019 09/30/2018 09/20/2017 01/08/2017 09/26/2016 09/19/2016  PHQ - 2 Score 0 0 0 0 0 0 0  PHQ- 9 Score 0 0 0 0 - - -    Fall Risk Fall Risk  10/12/2020 10/10/2019 09/30/2018 09/20/2017 01/08/2017  Falls in the past year? 0 0 0 No No  Number falls in past yr: 0 0 - - -  Injury with  Fall? 0 0 - - -  Risk for fall due to : No Fall Risks No Fall Risks - - -  Follow up Falls evaluation completed;Falls prevention discussed Falls evaluation completed;Falls prevention discussed - - -    FALL RISK PREVENTION PERTAINING TO THE HOME:  Any stairs in or around the home? Yes  If so, are there any without handrails? No  Home free of loose throw rugs in walkways, pet beds, electrical cords, etc? Yes  Adequate lighting in your home to reduce risk of falls? Yes   ASSISTIVE DEVICES UTILIZED TO PREVENT FALLS:  Life alert? No  Use of a cane, walker or w/c? No  Grab bars in the bathroom? No  Shower chair or bench in shower? No  Elevated toilet seat or a handicapped toilet? No   TIMED UP AND GO:  Was the test performed? N/A telephone visit.    Cognitive Function: MMSE - Mini Mental State Exam 10/12/2020 10/10/2019 09/30/2018 09/20/2017 09/19/2016  Orientation to time 5 5 5 5 5   Orientation to Place 5 5 5 5 5   Registration 3 3 3 3 3   Attention/ Calculation 5 5 0 0 0  Recall 3 3 3 3 3   Language- name 2 objects - - 0 0 0  Language- repeat 1 1 1 1 1   Language- follow 3 step command - - 0 3 3  Language- read & follow direction - - 0 0 0  Write a sentence - - 0 0 0  Copy design - - 0 0 0  Total score - - 17 20 20   Mini Cog  Mini-Cog screen was completed. Maximum score is 22. A value of 0 denotes this part of the MMSE was not completed or the patient failed this part of the Mini-Cog screening.       Immunizations Immunization History  Administered Date(s) Administered  . Influenza,inj,Quad PF,6+ Mos 04/02/2018, 03/14/2019  . Influenza-Unspecified 02/08/2016, 03/27/2017, 04/02/2018, 03/24/2020  . PFIZER(Purple  Top)SARS-COV-2 Vaccination 07/20/2019, 08/12/2019, 03/01/2020  . Pneumococcal Conjugate-13 09/03/2014  . Pneumococcal Polysaccharide-23 09/07/2015  . Zoster 10/23/2014    TDAP status: Due, Education has been provided regarding the importance of this vaccine. Advised  may receive this vaccine at local pharmacy or Health Dept. Aware to provide a copy of the vaccination record if obtained from local pharmacy or Health Dept. Verbalized acceptance and understanding.  Flu Vaccine status: Up to date  Pneumococcal vaccine status: Up to date  Covid-19 vaccine status: completed 3 vaccines, due for #4 booster. Patient aware.  Qualifies for Shingles Vaccine? Yes   Zostavax completed Yes   Shingrix Completed?: No.    Education has been provided regarding the importance of this vaccine. Patient has been advised to call insurance company to determine out of pocket expense if they have not yet received this vaccine. Advised may also receive vaccine at local pharmacy or Health Dept. Verbalized acceptance and understanding.  Screening Tests Health Maintenance  Topic Date Due  . COVID-19 Vaccine (4 - Booster for Pfizer series) 06/01/2020  . TETANUS/TDAP  10/13/2022 (Originally 01/12/1970)  . INFLUENZA VACCINE  12/27/2020  . COLONOSCOPY (Pts 45-60yrs Insurance coverage will need to be confirmed)  05/03/2023  . Hepatitis C Screening  Completed  . PNA vac Low Risk Adult  Completed  . HPV VACCINES  Aged Out    Health Maintenance  Health Maintenance Due  Topic Date Due  . COVID-19 Vaccine (4 - Booster for Pfizer series) 06/01/2020    Colorectal cancer screening: Type of screening: Colonoscopy. Completed 05/02/2013. Repeat every 10 years  Lung Cancer Screening: (Low Dose CT Chest recommended if Age 56-80 years, 30 pack-year currently smoking OR have quit w/in 15years.) does not qualify.    Additional Screening:  Hepatitis C Screening: does qualify; Completed 09/07/2015  Vision Screening: Recommended annual ophthalmology exams for early detection of glaucoma and other disorders of the eye. Is the patient up to date with their annual eye exam?  Yes  Who is the provider or what is the name of the office in which the patient attends annual eye exams? [facility] If  pt is not established with a provider, would they like to be referred to a provider to establish care? No .   Dental Screening: Recommended annual dental exams for proper oral hygiene  Community Resource Referral / Chronic Care Management: CRR required this visit?  No   CCM required this visit?  No      Plan:     I have personally reviewed and noted the following in the patient's chart:   . Medical and social history . Use of alcohol, tobacco or illicit drugs  . Current medications and supplements including opioid prescriptions. Patient is not currently taking opioid prescriptions. . Functional ability and status . Nutritional status . Physical activity . Advanced directives . List of other physicians . Hospitalizations, surgeries, and ER visits in previous 12 months . Vitals . Screenings to include cognitive, depression, and falls . Referrals and appointments  In addition, I have reviewed and discussed with patient certain preventive protocols, quality metrics, and best practice recommendations. A written personalized care plan for preventive services as well as general preventive health recommendations were provided to patient.   Due to this being a telephonic visit, the after visit summary with patients personalized plan was offered to patient via office or my-chart. Patient preferred to pick up at office at next visit or via mychart.   Delma, Villalva, LPN   10/24/4130

## 2020-10-12 NOTE — Patient Instructions (Signed)
Robert Perez , Thank you for taking time to come for your Medicare Wellness Visit. I appreciate your ongoing commitment to your health goals. Please review the following plan we discussed and let me know if I can assist you in the future.   Screening recommendations/referrals: Colonoscopy: Up to date, completed 05/02/2013, due 04/2023 Recommended yearly ophthalmology/optometry visit for glaucoma screening and checkup Recommended yearly dental visit for hygiene and checkup  Vaccinations: Influenza vaccine: Up to date, completed 03/24/2020, due 12/2020 Pneumococcal vaccine: Completed series Tdap vaccine: decline-insurance  Shingles vaccine: due, check with your insurance regarding coverage if interested    Covid-19: completed 3 vaccines, due for #4 booster. Patient aware.  Advanced directives: Advance directive discussed with you today. Even though you declined this today please call our office should you change your mind and we can give you the proper paperwork for you to fill out.  Conditions/risks identified: none  Next appointment: Follow up in one year for your annual wellness visit.   Preventive Care 35 Years and Older, Male Preventive care refers to lifestyle choices and visits with your health care provider that can promote health and wellness. What does preventive care include?  A yearly physical exam. This is also called an annual well check.  Dental exams once or twice a year.  Routine eye exams. Ask your health care provider how often you should have your eyes checked.  Personal lifestyle choices, including:  Daily care of your teeth and gums.  Regular physical activity.  Eating a healthy diet.  Avoiding tobacco and drug use.  Limiting alcohol use.  Practicing safe sex.  Taking low doses of aspirin every day.  Taking vitamin and mineral supplements as recommended by your health care provider. What happens during an annual well check? The services and screenings  done by your health care provider during your annual well check will depend on your age, overall health, lifestyle risk factors, and family history of disease. Counseling  Your health care provider may ask you questions about your:  Alcohol use.  Tobacco use.  Drug use.  Emotional well-being.  Home and relationship well-being.  Sexual activity.  Eating habits.  History of falls.  Memory and ability to understand (cognition).  Work and work Statistician. Screening  You may have the following tests or measurements:  Height, weight, and BMI.  Blood pressure.  Lipid and cholesterol levels. These may be checked every 5 years, or more frequently if you are over 38 years old.  Skin check.  Lung cancer screening. You may have this screening every year starting at age 8 if you have a 30-pack-year history of smoking and currently smoke or have quit within the past 15 years.  Fecal occult blood test (FOBT) of the stool. You may have this test every year starting at age 59.  Flexible sigmoidoscopy or colonoscopy. You may have a sigmoidoscopy every 5 years or a colonoscopy every 10 years starting at age 51.  Prostate cancer screening. Recommendations will vary depending on your family history and other risks.  Hepatitis C blood test.  Hepatitis B blood test.  Sexually transmitted disease (STD) testing.  Diabetes screening. This is done by checking your blood sugar (glucose) after you have not eaten for a while (fasting). You may have this done every 1-3 years.  Abdominal aortic aneurysm (AAA) screening. You may need this if you are a current or former smoker.  Osteoporosis. You may be screened starting at age 59 if you are at high risk.  Talk with your health care provider about your test results, treatment options, and if necessary, the need for more tests. Vaccines  Your health care provider may recommend certain vaccines, such as:  Influenza vaccine. This is recommended  every year.  Tetanus, diphtheria, and acellular pertussis (Tdap, Td) vaccine. You may need a Td booster every 10 years.  Zoster vaccine. You may need this after age 30.  Pneumococcal 13-valent conjugate (PCV13) vaccine. One dose is recommended after age 15.  Pneumococcal polysaccharide (PPSV23) vaccine. One dose is recommended after age 55. Talk to your health care provider about which screenings and vaccines you need and how often you need them. This information is not intended to replace advice given to you by your health care provider. Make sure you discuss any questions you have with your health care provider. Document Released: 06/11/2015 Document Revised: 02/02/2016 Document Reviewed: 03/16/2015 Elsevier Interactive Patient Education  2017 [place] Prevention in the Home Falls can cause injuries. They can happen to people of all ages. There are many things you can do to make your home safe and to help prevent falls. What can I do on the outside of my home?  Regularly fix the edges of walkways and driveways and fix any cracks.  Remove anything that might make you trip as you walk through a door, such as a raised step or threshold.  Trim any bushes or trees on the path to your home.  Use bright outdoor lighting.  Clear any walking paths of anything that might make someone trip, such as rocks or tools.  Regularly check to see if handrails are loose or broken. Make sure that both sides of any steps have handrails.  Any raised decks and porches should have guardrails on the edges.  Have any leaves, snow, or ice cleared regularly.  Use sand or salt on walking paths during winter.  Clean up any spills in your garage right away. This includes oil or grease spills. What can I do in the bathroom?  Use night lights.  Install grab bars by the toilet and in the tub and shower. Do not use towel bars as grab bars.  Use non-skid mats or decals in the tub or shower.  If  you need to sit down in the shower, use a plastic, non-slip stool.  Keep the floor dry. Clean up any water that spills on the floor as soon as it happens.  Remove soap buildup in the tub or shower regularly.  Attach bath mats securely with double-sided non-slip rug tape.  Do not have throw rugs and other things on the floor that can make you trip. What can I do in the bedroom?  Use night lights.  Make sure that you have a light by your bed that is easy to reach.  Do not use any sheets or blankets that are too big for your bed. They should not hang down onto the floor.  Have a firm chair that has side arms. You can use this for support while you get dressed.  Do not have throw rugs and other things on the floor that can make you trip. What can I do in the kitchen?  Clean up any spills right away.  Avoid walking on wet floors.  Keep items that you use a lot in easy-to-reach places.  If you need to reach something above you, use a strong step stool that has a grab bar.  Keep electrical cords out of the way.  Do not use floor polish or wax that makes floors slippery. If you must use wax, use non-skid floor wax.  Do not have throw rugs and other things on the floor that can make you trip. What can I do with my stairs?  Do not leave any items on the stairs.  Make sure that there are handrails on both sides of the stairs and use them. Fix handrails that are broken or loose. Make sure that handrails are as long as the stairways.  Check any carpeting to make sure that it is firmly attached to the stairs. Fix any carpet that is loose or worn.  Avoid having throw rugs at the top or bottom of the stairs. If you do have throw rugs, attach them to the floor with carpet tape.  Make sure that you have a light switch at the top of the stairs and the bottom of the stairs. If you do not have them, ask someone to add them for you. What else can I do to help prevent falls?  Wear shoes  that:  Do not have high heels.  Have rubber bottoms.  Are comfortable and fit you well.  Are closed at the toe. Do not wear sandals.  If you use a stepladder:  Make sure that it is fully opened. Do not climb a closed stepladder.  Make sure that both sides of the stepladder are locked into place.  Ask someone to hold it for you, if possible.  Clearly mark and make sure that you can see:  Any grab bars or handrails.  First and last steps.  Where the edge of each step is.  Use tools that help you move around (mobility aids) if they are needed. These include:  Canes.  Walkers.  Scooters.  Crutches.  Turn on the lights when you go into a dark area. Replace any light bulbs as soon as they burn out.  Set up your furniture so you have a clear path. Avoid moving your furniture around.  If any of your floors are uneven, fix them.  If there are any pets around you, be aware of where they are.  Review your medicines with your doctor. Some medicines can make you feel dizzy. This can increase your chance of falling. Ask your doctor what other things that you can do to help prevent falls. This information is not intended to replace advice given to you by your health care provider. Make sure you discuss any questions you have with your health care provider. Document Released: 03/11/2009 Document Revised: 10/21/2015 Document Reviewed: 06/19/2014 Elsevier Interactive Patient Education  2017 [employer].

## 2020-10-12 NOTE — Progress Notes (Signed)
PCP notes:  Health Maintenance: Covid booster- due    Abnormal Screenings: none   Patient concerns: none   Nurse concerns: none   Next PCP appt.: 10/21/2020 @ 2:30 pm

## 2020-10-13 ENCOUNTER — Other Ambulatory Visit: Payer: Self-pay

## 2020-10-13 ENCOUNTER — Other Ambulatory Visit (INDEPENDENT_AMBULATORY_CARE_PROVIDER_SITE_OTHER): Payer: Medicare HMO

## 2020-10-13 DIAGNOSIS — E785 Hyperlipidemia, unspecified: Secondary | ICD-10-CM

## 2020-10-13 LAB — LIPID PANEL
Cholesterol: 243 mg/dL — ABNORMAL HIGH (ref 0–200)
HDL: 69.3 mg/dL (ref >39.00)
LDL Cholesterol: 160 mg/dL — ABNORMAL HIGH (ref 0–99)
NonHDL: 174.12
Total CHOL/HDL Ratio: 4
Triglycerides: 70 mg/dL (ref 0.0–149.0)
VLDL: 14 mg/dL (ref 0.0–40.0)

## 2020-10-13 LAB — COMPREHENSIVE METABOLIC PANEL
ALT: 14 U/L (ref 0–53)
AST: 15 U/L (ref 0–37)
Albumin: 4.3 g/dL (ref 3.5–5.2)
Alkaline Phosphatase: 44 U/L (ref 39–117)
BUN: 19 mg/dL (ref 6–23)
CO2: 29 mEq/L (ref 19–32)
Calcium: 9.6 mg/dL (ref 8.4–10.5)
Chloride: 103 mEq/L (ref 96–112)
Creatinine, Ser: 1.12 mg/dL (ref 0.40–1.50)
GFR: 65.98 mL/min (ref >60.00)
Glucose, Bld: 97 mg/dL (ref 70–99)
Potassium: 4.1 mEq/L (ref 3.5–5.1)
Sodium: 139 mEq/L (ref 135–145)
Total Bilirubin: 0.7 mg/dL (ref 0.2–1.2)
Total Protein: 6.9 g/dL (ref 6.0–8.3)

## 2020-10-21 ENCOUNTER — Ambulatory Visit (INDEPENDENT_AMBULATORY_CARE_PROVIDER_SITE_OTHER): Payer: Medicare HMO | Admitting: Family Medicine

## 2020-10-21 ENCOUNTER — Encounter: Payer: Self-pay | Admitting: Family Medicine

## 2020-10-21 ENCOUNTER — Other Ambulatory Visit: Payer: Self-pay

## 2020-10-21 DIAGNOSIS — E785 Hyperlipidemia, unspecified: Secondary | ICD-10-CM | POA: Diagnosis not present

## 2020-10-21 DIAGNOSIS — M545 Low back pain, unspecified: Secondary | ICD-10-CM | POA: Diagnosis not present

## 2020-10-21 DIAGNOSIS — Z0000 Encounter for general adult medical examination without abnormal findings: Secondary | ICD-10-CM

## 2020-10-21 DIAGNOSIS — Z7189 Other specified counseling: Secondary | ICD-10-CM

## 2020-10-21 DIAGNOSIS — Z8546 Personal history of malignant neoplasm of prostate: Secondary | ICD-10-CM

## 2020-10-21 NOTE — Patient Instructions (Signed)
I would limit heavy lifting and use the home exercises.  Take care.  Glad to see you.

## 2020-10-21 NOTE — Progress Notes (Signed)
This visit occurred during the SARS-CoV-2 public health emergency.  Safety protocols were in place, including screening questions prior to the visit, additional usage of staff PPE, and extensive cleaning of exam room while observing appropriate contact time as indicated for disinfecting solutions.  H/o prostate cancer.  Still on flomax at baseline.  PSA lower, per urology.  No burning with urination, no blood seen in urine.  He felt okay cutting back to 1 Flomax pill a day but noticed a change in urination with skipping the dose totally.  Stream is good with 1 pill a day.  covid vaccine 2021 Tetanus shot d/w pt. He can get that at work. PNA vaccine UTD.   shingrix d/w pt.  Flu shot yearly Normal colonoscopy 2014, d/w pt.  PSA with urology.   Living will d/w pt- his daughter would be designated if patient were incapacitated.   Lipids are up but he hasn't been cycling as much.  Discussed.  Back pain.  His brother in law has dementia and needed help getting him off the floor.  This was 10/17/20.  He was helping lift him "and my back said 'no you're not'".  He had tingling in the legs at the time and a soreness in his lower back.  No foot drop.  No sx now.  He is still walking at baseline, able to work at baseline.    Meds, vitals, and allergies reviewed.   ROS: Per HPI unless specifically indicated in ROS section   GEN: nad, alert and oriented HEENT: ncat NECK: supple w/o LA CV: rrr.  PULM: ctab, no inc wob ABD: soft, +bs EXT: no edema SKIN: no acute rash Normal S/S x4.   Back not tender to palpation.

## 2020-10-25 DIAGNOSIS — Z8546 Personal history of malignant neoplasm of prostate: Secondary | ICD-10-CM | POA: Insufficient documentation

## 2020-10-25 DIAGNOSIS — E785 Hyperlipidemia, unspecified: Secondary | ICD-10-CM | POA: Insufficient documentation

## 2020-10-25 NOTE — Assessment & Plan Note (Signed)
Lipids are up but he hasn't been cycling as much.  Discussed.  He plans to get back to cycling frequently and that should help.  We can recheck periodically.

## 2020-10-25 NOTE — Assessment & Plan Note (Signed)
Per urology and I will defer.  Would continue Flomax given his improvement in symptoms with 1 pill a day, no adverse effect on medication.

## 2020-10-25 NOTE — Assessment & Plan Note (Signed)
Living will d/w pt- his daughter would be designated if patient were incapacitated.

## 2020-10-25 NOTE — Assessment & Plan Note (Signed)
Likely a strain with sciatic nerve irritation but fortunately no symptoms now.  Home exercise program and exercises discussed with patient, handout given to patient and explained.  He will use those and update me as needed.  Normal strength and sensation today.

## 2020-10-25 NOTE — Assessment & Plan Note (Signed)
covid vaccine 2021 Tetanus shot d/w pt. He can get that at work. PNA vaccine UTD.   shingrix d/w pt.  Flu shot yearly Normal colonoscopy 2014, d/w pt.  PSA with urology.   Living will d/w pt- his daughter would be designated if patient were incapacitated.

## 2020-10-30 DIAGNOSIS — Z20822 Contact with and (suspected) exposure to covid-19: Secondary | ICD-10-CM | POA: Diagnosis not present

## 2020-11-01 ENCOUNTER — Telehealth: Payer: Self-pay

## 2020-11-01 NOTE — Telephone Encounter (Signed)
Pt wanted to let Dr Damita Dunnings know he tested positive for Covid on a home test Friday (6-3) and PCR on last night. Only has a sore throat. Symptom started Thursday (6-2). I advised him if he has shortness of breath to go to ER. Otherwise, let it run its course as he only has a sore throat.

## 2020-11-01 NOTE — Telephone Encounter (Signed)
Spoke with patient and he is still doing well. Patient only has a sore throat and thinks he is at the end of covid and wants to continue to run its course.

## 2020-11-01 NOTE — Telephone Encounter (Signed)
If he has only mild symptoms at this point, then wouldn't likely benefit from antiviral treatment.  However, if any worsening sx in the meantime then let me know.  Please update patient today and check on him tomorrow.  Thanks.

## 2021-01-03 DIAGNOSIS — Z8546 Personal history of malignant neoplasm of prostate: Secondary | ICD-10-CM | POA: Diagnosis not present

## 2021-02-28 DIAGNOSIS — Z23 Encounter for immunization: Secondary | ICD-10-CM | POA: Diagnosis not present

## 2021-07-07 DIAGNOSIS — Z8546 Personal history of malignant neoplasm of prostate: Secondary | ICD-10-CM | POA: Diagnosis not present

## 2021-07-14 DIAGNOSIS — R350 Frequency of micturition: Secondary | ICD-10-CM | POA: Diagnosis not present

## 2021-07-14 DIAGNOSIS — Z8546 Personal history of malignant neoplasm of prostate: Secondary | ICD-10-CM | POA: Diagnosis not present

## 2021-07-14 DIAGNOSIS — R3121 Asymptomatic microscopic hematuria: Secondary | ICD-10-CM | POA: Diagnosis not present

## 2021-07-27 DIAGNOSIS — R3121 Asymptomatic microscopic hematuria: Secondary | ICD-10-CM | POA: Diagnosis not present

## 2021-07-27 DIAGNOSIS — R319 Hematuria, unspecified: Secondary | ICD-10-CM | POA: Diagnosis not present

## 2021-08-03 DIAGNOSIS — R3121 Asymptomatic microscopic hematuria: Secondary | ICD-10-CM | POA: Diagnosis not present

## 2021-10-13 ENCOUNTER — Ambulatory Visit (INDEPENDENT_AMBULATORY_CARE_PROVIDER_SITE_OTHER): Payer: Medicare HMO

## 2021-10-13 VITALS — Wt 187.0 lb

## 2021-10-13 DIAGNOSIS — Z0000 Encounter for general adult medical examination without abnormal findings: Secondary | ICD-10-CM | POA: Diagnosis not present

## 2021-10-13 NOTE — Patient Instructions (Signed)
[name] , Thank you for taking time to come for your Medicare Wellness Visit. I appreciate your ongoing commitment to your health goals. Please review the following plan we discussed and let me know if I can assist you in the future.   Screening recommendations/referrals: Colonoscopy: Done 05/02/2013 -  Repeat in 10 years Recommended yearly ophthalmology/optometry visit for glaucoma screening and checkup Recommended yearly dental visit for hygiene and checkup  Vaccinations: Influenza vaccine: Due every fall Pneumococcal vaccine: Done 09/03/2014 & 09/07/2015 Tdap vaccine: Due - recommended every 10 years Shingles vaccine: Zostavax done 2016 - due for Shingrix which is 2 doses 2-6 months apart and over 90% effective     Covid-19: Done 07/20/2019, 08/12/2019, 03/01/2020, & 02/28/2021  Advanced directives: Advance directive discussed with you today. Even though you declined this today, please call our office should you change your mind, and we can give you the proper paperwork for you to fill out.   Conditions/risks identified: Aim for 30 minutes of exercise or brisk walking, 6-8 glasses of water, and 5 servings of fruits and vegetables each day.   Next appointment: Follow up in one year for your annual wellness visit.   Preventive Care 73 Years and Older, Male  Preventive care refers to lifestyle choices and visits with your health care provider that can promote health and wellness. What does preventive care include? A yearly physical exam. This is also called an annual well check. Dental exams once or twice a year. Routine eye exams. Ask your health care provider how often you should have your eyes checked. Personal lifestyle choices, including: Daily care of your teeth and gums. Regular physical activity. Eating a healthy diet. Avoiding tobacco and drug use. Limiting alcohol use. Practicing safe sex. Taking low doses of aspirin every day. Taking vitamin and mineral supplements as  recommended by your health care provider. What happens during an annual well check? The services and screenings done by your health care provider during your annual well check will depend on your age, overall health, lifestyle risk factors, and family history of disease. Counseling  Your health care provider may ask you questions about your: Alcohol use. Tobacco use. Drug use. Emotional well-being. Home and relationship well-being. Sexual activity. Eating habits. History of falls. Memory and ability to understand (cognition). Work and work Statistician. Screening  You may have the following tests or measurements: Height, weight, and BMI. Blood pressure. Lipid and cholesterol levels. These may be checked every 5 years, or more frequently if you are over 8 years old. Skin check. Lung cancer screening. You may have this screening every year starting at age 96 if you have a 30-pack-year history of smoking and currently smoke or have quit within the past 15 years. Fecal occult blood test (FOBT) of the stool. You may have this test every year starting at age 53. Flexible sigmoidoscopy or colonoscopy. You may have a sigmoidoscopy every 5 years or a colonoscopy every 10 years starting at age 59. Prostate cancer screening. Recommendations will vary depending on your family history and other risks. Hepatitis C blood test. Hepatitis B blood test. Sexually transmitted disease (STD) testing. Diabetes screening. This is done by checking your blood sugar (glucose) after you have not eaten for a while (fasting). You may have this done every 1-3 years. Abdominal aortic aneurysm (AAA) screening. You may need this if you are a current or former smoker. Osteoporosis. You may be screened starting at age 17 if you are at high risk. Talk with  your health care provider about your test results, treatment options, and if necessary, the need for more tests. Vaccines  Your health care provider may recommend  certain vaccines, such as: Influenza vaccine. This is recommended every year. Tetanus, diphtheria, and acellular pertussis (Tdap, Td) vaccine. You may need a Td booster every 10 years. Zoster vaccine. You may need this after age 5. Pneumococcal 13-valent conjugate (PCV13) vaccine. One dose is recommended after age 52. Pneumococcal polysaccharide (PPSV23) vaccine. One dose is recommended after age 61. Talk to your health care provider about which screenings and vaccines you need and how often you need them. This information is not intended to replace advice given to you by your health care provider. Make sure you discuss any questions you have with your health care provider. Document Released: 06/11/2015 Document Revised: 02/02/2016 Document Reviewed: 03/16/2015 Elsevier Interactive Patient Education  2017 [place] Prevention in the Home Falls can cause injuries. They can happen to people of all ages. There are many things you can do to make your home safe and to help prevent falls. What can I do on the outside of my home? Regularly fix the edges of walkways and driveways and fix any cracks. Remove anything that might make you trip as you walk through a door, such as a raised step or threshold. Trim any bushes or trees on the path to your home. Use bright outdoor lighting. Clear any walking paths of anything that might make someone trip, such as rocks or tools. Regularly check to see if handrails are loose or broken. Make sure that both sides of any steps have handrails. Any raised decks and porches should have guardrails on the edges. Have any leaves, snow, or ice cleared regularly. Use sand or salt on walking paths during winter. Clean up any spills in your garage right away. This includes oil or grease spills. What can I do in the bathroom? Use night lights. Install grab bars by the toilet and in the tub and shower. Do not use towel bars as grab bars. Use non-skid mats or  decals in the tub or shower. If you need to sit down in the shower, use a plastic, non-slip stool. Keep the floor dry. Clean up any water that spills on the floor as soon as it happens. Remove soap buildup in the tub or shower regularly. Attach bath mats securely with double-sided non-slip rug tape. Do not have throw rugs and other things on the floor that can make you trip. What can I do in the bedroom? Use night lights. Make sure that you have a light by your bed that is easy to reach. Do not use any sheets or blankets that are too big for your bed. They should not hang down onto the floor. Have a firm chair that has side arms. You can use this for support while you get dressed. Do not have throw rugs and other things on the floor that can make you trip. What can I do in the kitchen? Clean up any spills right away. Avoid walking on wet floors. Keep items that you use a lot in easy-to-reach places. If you need to reach something above you, use a strong step stool that has a grab bar. Keep electrical cords out of the way. Do not use floor polish or wax that makes floors slippery. If you must use wax, use non-skid floor wax. Do not have throw rugs and other things on the floor that can make you trip.  What can I do with my stairs? Do not leave any items on the stairs. Make sure that there are handrails on both sides of the stairs and use them. Fix handrails that are broken or loose. Make sure that handrails are as long as the stairways. Check any carpeting to make sure that it is firmly attached to the stairs. Fix any carpet that is loose or worn. Avoid having throw rugs at the top or bottom of the stairs. If you do have throw rugs, attach them to the floor with carpet tape. Make sure that you have a light switch at the top of the stairs and the bottom of the stairs. If you do not have them, ask someone to add them for you. What else can I do to help prevent falls? Wear shoes that: Do not  have high heels. Have rubber bottoms. Are comfortable and fit you well. Are closed at the toe. Do not wear sandals. If you use a stepladder: Make sure that it is fully opened. Do not climb a closed stepladder. Make sure that both sides of the stepladder are locked into place. Ask someone to hold it for you, if possible. Clearly mark and make sure that you can see: Any grab bars or handrails. First and last steps. Where the edge of each step is. Use tools that help you move around (mobility aids) if they are needed. These include: Canes. Walkers. Scooters. Crutches. Turn on the lights when you go into a dark area. Replace any light bulbs as soon as they burn out. Set up your furniture so you have a clear path. Avoid moving your furniture around. If any of your floors are uneven, fix them. If there are any pets around you, be aware of where they are. Review your medicines with your doctor. Some medicines can make you feel dizzy. This can increase your chance of falling. Ask your doctor what other things that you can do to help prevent falls. This information is not intended to replace advice given to you by your health care provider. Make sure you discuss any questions you have with your health care provider. Document Released: 03/11/2009 Document Revised: 10/21/2015 Document Reviewed: 06/19/2014 Elsevier Interactive Patient Education  2017 [employer].

## 2021-10-13 NOTE — Progress Notes (Signed)
188#   Subjective:   Robert Perez is a 71 y.o. male who presents for Medicare Annual/Subsequent preventive examination.  Review of Systems     Cardiac Risk Factors include: advanced age (>23mn, >>14women);male gender;dyslipidemia     Objective:    Today's Vitals   10/13/21 1713  Weight: 187 lb (84.8 kg)   Body mass index is 25.36 kg/m.     10/13/2021    5:19 PM 10/12/2020    2:42 PM 10/10/2019    2:41 PM 09/30/2018    4:12 PM 09/20/2017   12:58 PM 03/16/2017    7:51 AM 01/08/2017    1:45 PM  Advanced Directives  Does Patient Have a Medical Advance Directive? No No No No No No No  Would patient like information on creating a medical advance directive? No - Patient declined No - Patient declined No - Patient declined No - Patient declined Yes (MAU/Ambulatory/Procedural Areas - Information given) No - Patient declined No - Patient declined    Current Medications (verified) Outpatient Encounter Medications as of 10/13/2021  Medication Sig   tamsulosin (FLOMAX) 0.4 MG CAPS capsule Take 1 capsule (0.4 mg total) by mouth daily.   No facility-administered encounter medications on file as of 10/13/2021.    Allergies (verified) Codeine and Nitroglycerin   History: Past Medical History:  Diagnosis Date   Allergic rhinitis due to pollen    Prostate cancer ([facility] urologist-  dr ottelin/oncologist-  dr mTammi Klippel  dx 12-05-2016-- Stage T1c,  Gleason 3+4,  PSA 7.78,  vol 33cc   PSA elevation    Wears glasses    Past Surgical History:  Procedure Laterality Date   COLONOSCOPY  2014   CYSTOSCOPY  03/16/2017   Procedure: CYSTOSCOPY;  Surgeon: OKathie Rhodes MD;  [facility]  Service: Urology;;  no seeds found in bladder   PROSTATE BIOPSY  12-05-2016   dr oKarsten Rooffice   RADIOACTIVE SEED IMPLANT N/A 03/16/2017   Procedure: RADIOACTIVE SEED IMPLANT/BRACHYTHERAPY IMPLANT WITH SPACE OAR INSTILLATION;  Surgeon: OKathie Rhodes MD;  [facility]  Service: Urology;  Laterality: N/A;    671seeds implanted   Family History  Problem Relation Age of Onset   Diabetes Mother    Arthritis Father    Hyperlipidemia Father    Hypertension Father    Kidney disease Father    Prostate cancer Father        dx'd late in life.    Breast cancer Sister    Colon cancer Maternal Grandfather    Cancer Brother    Bladder Cancer Brother    Breast cancer Sister    Breast cancer Sister    Pancreatic cancer Neg Hx    Stomach cancer Neg Hx    Social History   Socioeconomic History   Marital status: Single    Spouse name: Not on file   Number of children: Not on file   Years of education: Not on file   Highest education level: Not on file  Occupational History   Occupation: retired  Tobacco Use   Smoking status: Never   Smokeless tobacco: Never  Vaping Use   Vaping Use: Never used  Substance and Sexual Activity   Alcohol use: No    Alcohol/week: 0.0 standard drinks   Drug use: No   Sexual activity: Never  Other Topics Concern   Not on file  Social History Narrative   Lifelong single.   Has one adult daughter.  Daughter is working in [place] for [employer].    [place] since 2004. 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     Social Determinants of Health   Financial Resource Strain: Low Risk    Difficulty of Paying Living Expenses: Not hard at all  Food Insecurity: No Food Insecurity   Worried About Charity fundraiser in the Last Year: Never true   [place] in the Last Year: Never true  Transportation Needs: No Transportation Needs   Lack of Transportation (Medical): No   Lack of Transportation (Non-Medical): No  Physical Activity: Sufficiently Active   Days of Exercise per Week: 7 days   Minutes of Exercise per Session: 150+ min  Stress: No Stress Concern Present   Feeling of Stress : Not at  all  Social Connections: Moderately Integrated   Frequency of Communication with Friends and Family: More than three times a week   Frequency of Social Gatherings with Friends and Family: More than three times a week   Attends Religious Services: More than 4 times per year   Active Member of [employer] or Organizations: Yes   Attends Music therapist: More than 4 times per year   Marital Status: Never married    Tobacco Counseling Counseling given: Not Answered   Clinical Intake:  Pre-visit preparation completed: Yes  Pain : No/denies pain     BMI - recorded: 25.36 Nutritional Status: BMI 25 -29 Overweight Nutritional Risks: None Diabetes: No  How often do you need to have someone help you when you read instructions, pamphlets, or other written materials from your doctor or pharmacy?: 1 - Never  Diabetic? no  Interpreter Needed?: No  Information entered by :: Dayshaun Whobrey, LPN   Activities of Daily Living    10/13/2021    5:19 PM  In your present state of health, do you have any difficulty performing the following activities:  Hearing? 0  Vision? 0  Difficulty concentrating or making decisions? 0  Walking or climbing stairs? 0  Dressing or bathing? 0  Doing errands, shopping? 0  Preparing Food and eating ? N  Using the Toilet? N  In the past six months, have you accidently leaked urine? N  Do you have problems with loss of bowel control? N  Managing your Medications? N  Managing your Finances? N  Housekeeping or managing your Housekeeping? N    Patient Care Team: Tonia Ghent, MD as PCP - General (Family Medicine)  Indicate any recent Medical Services you may have received from other than [facility] providers in the past year (date may be approximate).     Assessment:   This is a routine wellness examination for Robert Perez.  Hearing/Vision screen Hearing Screening - Comments:: Denies hearing difficulties   Vision Screening - Comments:: Wears rx glasses  - behind with routine eye exams with [facility] - hasn't been seen in about 3 years - advised to make appt soon.  Dietary issues and exercise activities discussed: Current Exercise Habits: Home exercise routine, Type of exercise: Other - see comments;walking;stretching (cycling), Time (Minutes): 60, Frequency (Times/Week): 7, Weekly Exercise (Minutes/Week): 420, Intensity: Moderate, Exercise limited by: None identified   Goals Addressed             This Visit's Progress    Patient Stated   On track  10/13/2021 I will continue to cycle 4 days a week for 15 [address]- [address].       Depression Screen    10/13/2021    5:17 PM 10/12/2020    2:43 PM 10/10/2019    2:42 PM 09/30/2018    8:34 AM 09/20/2017   12:52 PM 01/08/2017    1:46 PM 09/26/2016    2:09 PM  PHQ 2/9 Scores  PHQ - 2 Score 0 0 0 0 0 0 0  PHQ- 9 Score  0 0 0 0      Fall Risk    10/13/2021    5:15 PM 10/12/2020    2:42 PM 10/10/2019    2:42 PM 09/30/2018    8:34 AM 09/20/2017   12:52 PM  [place] in the past year? 0 0 0 0 No  Number falls in past yr: 0 0 0    Injury with Fall? 0 0 0    Risk for fall due to : No Fall Risks No Fall Risks No Fall Risks    Follow up Falls prevention discussed Falls evaluation completed;Falls prevention discussed Falls evaluation completed;Falls prevention discussed      FALL RISK PREVENTION PERTAINING TO THE HOME:  Any stairs in or around the home? No  If so, are there any without handrails? No  Home free of loose throw rugs in walkways, pet beds, electrical cords, etc? Yes  Adequate lighting in your home to reduce risk of falls? Yes   ASSISTIVE DEVICES UTILIZED TO PREVENT FALLS:  Life alert? No  Use of a cane, walker or w/c? No  Grab bars in the bathroom? Yes  Shower chair or bench in shower? No  Elevated toilet seat or a handicapped toilet? No   TIMED UP AND GO:  Was the test performed? No . Telephonic visit  Cognitive Function:    10/12/2020    2:45 PM 10/10/2019     2:46 PM 09/30/2018    8:35 AM 09/20/2017   12:53 PM 09/19/2016    1:08 PM  MMSE - Mini Mental State Exam  Orientation to time '5 5 5 5 5  '$ Orientation to Place '5 5 5 5 5  '$ Registration '3 3 3 3 3  '$ Attention/ Calculation 5 5 0 0 0  Recall '3 3 3 3 3  '$ Language- name 2 objects   0 0 0  Language- repeat '1 1 1 1 1  '$ Language- follow 3 step command   0 3 3  Language- read & follow direction   0 0 0  Write a sentence   0 0 0  Copy design   0 0 0  Total score   '17 20 20        '$ 10/13/2021    5:20 PM  6CIT Screen  What Year? 0 points  What month? 0 points  What time? 0 points  Count back from 20 0 points  Months in reverse 2 points  Repeat phrase 8 points  Total Score 10 points    Immunizations Immunization History  Administered Date(s) Administered   Influenza,inj,Quad PF,6+ Mos 04/02/2018, 03/14/2019   Influenza-Unspecified 02/08/2016, 03/27/2017, 04/02/2018, 03/24/2020   PFIZER(Purple Top)SARS-COV-2 Vaccination 07/20/2019, 08/12/2019, 03/01/2020   Pneumococcal Conjugate-13 09/03/2014   Pneumococcal Polysaccharide-23 09/07/2015   Zoster, Live 10/23/2014    TDAP status: Due, Education has been provided regarding the importance of this vaccine. Advised may receive this vaccine at local pharmacy or Health Dept. Aware to provide a copy of the  vaccination record if obtained from local pharmacy or Health Dept. Verbalized acceptance and understanding.  Flu Vaccine status: Up to date  Pneumococcal vaccine status: Up to date  Covid-19 vaccine status: Completed vaccines  Qualifies for Shingles Vaccine? Yes   Zostavax completed Yes   Shingrix Completed?: No.    Education has been provided regarding the importance of this vaccine. Patient has been advised to call insurance company to determine out of pocket expense if they have not yet received this vaccine. Advised may also receive vaccine at local pharmacy or Health Dept. Verbalized acceptance and understanding.  Screening Tests Health  Maintenance  Topic Date Due   Zoster Vaccines- Shingrix (1 of 2) Never done   COVID-19 Vaccine (4 - Booster for Pfizer series) 04/26/2020   TETANUS/TDAP  10/13/2022 (Originally 01/12/1970)   INFLUENZA VACCINE  12/27/2021   COLONOSCOPY (Pts 45-18yr Insurance coverage will need to be confirmed)  05/03/2023   Pneumonia Vaccine 71 Years old  Completed   Hepatitis C Screening  Completed   HPV VACCINES  Aged Out    Health Maintenance  Health Maintenance Due  Topic Date Due   Zoster Vaccines- Shingrix (1 of 2) Never done   COVID-19 Vaccine (4 - Booster for Pfizer series) 04/26/2020    Colorectal cancer screening: Type of screening: Colonoscopy. Completed 05/02/2013. Repeat every 10 years  Lung Cancer Screening: (Low Dose CT Chest recommended if Age 73-80years, 30 pack-year currently smoking OR have quit w/in 15years.) does not qualify.   Additional Screening:  Hepatitis C Screening: does qualify; Completed 09/07/2015  Vision Screening: Recommended annual ophthalmology exams for early detection of glaucoma and other disorders of the eye. Is the patient up to date with their annual eye exam?  No  Who is the provider or what is the name of the office in which the patient attends annual eye exams? [facility] If pt is not established with a provider, would they like to be referred to a provider to establish care? No .   Dental Screening: Recommended annual dental exams for proper oral hygiene  Community Resource Referral / Chronic Care Management: CRR required this visit?  No   CCM required this visit?  No      Plan:     I have personally reviewed and noted the following in the patient's chart:   Medical and social history Use of alcohol, tobacco or illicit drugs  Current medications and supplements including opioid prescriptions. Patient is not currently taking opioid prescriptions. Functional ability and status Nutritional status Physical activity Advanced  directives List of other physicians Hospitalizations, surgeries, and ER visits in previous 12 months Vitals Screenings to include cognitive, depression, and falls Referrals and appointments  In addition, I have reviewed and discussed with patient certain preventive protocols, quality metrics, and best practice recommendations. A written personalized care plan for preventive services as well as general preventive health recommendations were provided to patient.     ASandrea Hammond LPN   [id number]  Nurse Notes: 6CIT = 10 - He says he just had a moment, and usually doesn't have any issues with his memory.

## 2021-10-24 ENCOUNTER — Other Ambulatory Visit: Payer: Self-pay | Admitting: Family Medicine

## 2021-10-24 DIAGNOSIS — E785 Hyperlipidemia, unspecified: Secondary | ICD-10-CM

## 2021-10-24 DIAGNOSIS — Z8546 Personal history of malignant neoplasm of prostate: Secondary | ICD-10-CM

## 2021-10-24 DIAGNOSIS — Z125 Encounter for screening for malignant neoplasm of prostate: Secondary | ICD-10-CM

## 2021-10-25 ENCOUNTER — Other Ambulatory Visit (INDEPENDENT_AMBULATORY_CARE_PROVIDER_SITE_OTHER): Payer: Medicare HMO

## 2021-10-25 DIAGNOSIS — Z8546 Personal history of malignant neoplasm of prostate: Secondary | ICD-10-CM

## 2021-10-25 DIAGNOSIS — Z125 Encounter for screening for malignant neoplasm of prostate: Secondary | ICD-10-CM | POA: Diagnosis not present

## 2021-10-25 DIAGNOSIS — E785 Hyperlipidemia, unspecified: Secondary | ICD-10-CM | POA: Diagnosis not present

## 2021-10-25 LAB — LIPID PANEL
Cholesterol: 219 mg/dL — ABNORMAL HIGH (ref 0–200)
HDL: 62.9 mg/dL (ref >39.00)
LDL Cholesterol: 141 mg/dL — ABNORMAL HIGH (ref 0–99)
NonHDL: 156.22
Total CHOL/HDL Ratio: 3
Triglycerides: 76 mg/dL (ref 0.0–149.0)
VLDL: 15.2 mg/dL (ref 0.0–40.0)

## 2021-10-25 LAB — COMPREHENSIVE METABOLIC PANEL
ALT: 16 U/L (ref 0–53)
AST: 15 U/L (ref 0–37)
Albumin: 4 g/dL (ref 3.5–5.2)
Alkaline Phosphatase: 50 U/L (ref 39–117)
BUN: 18 mg/dL (ref 6–23)
CO2: 30 mEq/L (ref 19–32)
Calcium: 9.6 mg/dL (ref 8.4–10.5)
Chloride: 104 mEq/L (ref 96–112)
Creatinine, Ser: 1.05 mg/dL (ref 0.40–1.50)
GFR: 70.78 mL/min (ref >60.00)
Glucose, Bld: 94 mg/dL (ref 70–99)
Potassium: 4.4 mEq/L (ref 3.5–5.1)
Sodium: 140 mEq/L (ref 135–145)
Total Bilirubin: 0.6 mg/dL (ref 0.2–1.2)
Total Protein: 6 g/dL (ref 6.0–8.3)

## 2021-10-25 LAB — PSA, MEDICARE: PSA: 0.1 ng/ml (ref 0.10–4.00)

## 2021-10-27 ENCOUNTER — Encounter: Payer: Self-pay | Admitting: Family Medicine

## 2021-10-27 ENCOUNTER — Ambulatory Visit (INDEPENDENT_AMBULATORY_CARE_PROVIDER_SITE_OTHER): Payer: Medicare HMO | Admitting: Family Medicine

## 2021-10-27 VITALS — BP 136/62 | HR 67 | Temp 98.0°F | Ht 72.0 in | Wt 190.0 lb

## 2021-10-27 DIAGNOSIS — Z0000 Encounter for general adult medical examination without abnormal findings: Secondary | ICD-10-CM

## 2021-10-27 DIAGNOSIS — Z8546 Personal history of malignant neoplasm of prostate: Secondary | ICD-10-CM

## 2021-10-27 DIAGNOSIS — R252 Cramp and spasm: Secondary | ICD-10-CM | POA: Diagnosis not present

## 2021-10-27 DIAGNOSIS — Z7189 Other specified counseling: Secondary | ICD-10-CM

## 2021-10-27 MED ORDER — TAMSULOSIN HCL 0.4 MG PO CAPS: 0.4000 mg | ORAL_CAPSULE | Freq: Every day | ORAL | 3 refills | Status: DC

## 2021-10-27 NOTE — Progress Notes (Signed)
He had urology f/u, with neg cystoscopy.  He has a plan for f/u imaging per urology about his kidney.  D/w pt about recent labs.  PSA still low.    He is still playing keyboard > bass recently.  He enjoys both.  No red flag memory changes.  We talked about brief lapses.  His attention is shorter than prev.  He is still working and not having trouble unless there is a new protocol in place.  He recalls the specific type/model number of electric piano/keyboard that he wants to buy.    He can bike for 50 [place] but then have some cramping.  This isn't ideal but given the level of exertion I would expect that is related to exertion/dehydration.  He does not have claudication otherwise over the first approximately 40+ [place] in the ride.  covid vaccine 2021 Tetanus shot d/w pt.  He can get that at work. PNA vaccine UTD.   shingrix d/w pt.   Flu shot yearly Normal colonoscopy 2014, d/w pt.  PSA 2023 Living will d/w pt- his daughter would be designated if patient were incapacitated.   Meds, vitals, and allergies reviewed.   ROS: Per HPI unless specifically indicated in ROS section   GEN: nad, alert and oriented HEENT: ncat NECK: supple w/o LA CV: rrr. PULM: ctab, no inc wob ABD: soft, +bs EXT: no edema SKIN: no acute rash

## 2021-10-27 NOTE — Patient Instructions (Signed)
Don't change your meds for now.  Update me as needed.  Thanks for your effort.  Take care.  Glad to see you.

## 2021-10-30 DIAGNOSIS — R252 Cramp and spasm: Secondary | ICD-10-CM | POA: Insufficient documentation

## 2021-10-30 NOTE — Assessment & Plan Note (Signed)
He does not have symptoms normally so I suspect this is just related to the extreme exertion as would be expected with a 50 mile bike ride and I talked with him about staying well-hydrated and routine cautions.  He will update me as needed.  No chest pain.

## 2021-10-30 NOTE — Assessment & Plan Note (Signed)
Living will d/w pt- his daughter would be designated if patient were incapacitated.

## 2021-10-30 NOTE — Assessment & Plan Note (Signed)
covid vaccine 2021 Tetanus shot d/w pt.  He can get that at work. PNA vaccine UTD.   shingrix d/w pt.   Flu shot yearly Normal colonoscopy 2014, d/w pt.  PSA 2023 Living will d/w pt- his daughter would be designated if patient were incapacitated.

## 2021-10-30 NOTE — Assessment & Plan Note (Signed)
He had urology f/u, with neg cystoscopy.  He has a plan for f/u imaging per urology about his kidney.  D/w pt about recent labs.  PSA still low.

## 2021-12-19 ENCOUNTER — Other Ambulatory Visit: Payer: Self-pay | Admitting: Urology

## 2021-12-19 DIAGNOSIS — N281 Cyst of kidney, acquired: Secondary | ICD-10-CM

## 2022-01-31 ENCOUNTER — Other Ambulatory Visit: Payer: Medicare HMO

## 2022-02-09 ENCOUNTER — Ambulatory Visit
Admission: RE | Admit: 2022-02-09 | Discharge: 2022-02-09 | Disposition: A | Discharge status: Home / Self Care | Payer: Medicare HMO | Source: Ambulatory Visit | Attending: Urology | Admitting: Urology

## 2022-02-09 DIAGNOSIS — K7689 Other specified diseases of liver: Secondary | ICD-10-CM | POA: Diagnosis not present

## 2022-02-09 DIAGNOSIS — N289 Disorder of kidney and ureter, unspecified: Secondary | ICD-10-CM | POA: Diagnosis not present

## 2022-02-09 DIAGNOSIS — N281 Cyst of kidney, acquired: Secondary | ICD-10-CM

## 2022-02-09 MED ORDER — GADOBENATE DIMEGLUMINE 529 MG/ML IV SOLN
18.0000 mL | Freq: Once | INTRAVENOUS | Status: AC | PRN
  Administered 2022-02-09: 18 mL via INTRAVENOUS

## 2022-02-10 DIAGNOSIS — N401 Enlarged prostate with lower urinary tract symptoms: Secondary | ICD-10-CM | POA: Diagnosis not present

## 2022-02-10 DIAGNOSIS — R350 Frequency of micturition: Secondary | ICD-10-CM | POA: Diagnosis not present

## 2022-02-10 DIAGNOSIS — N281 Cyst of kidney, acquired: Secondary | ICD-10-CM | POA: Diagnosis not present

## 2022-05-17 ENCOUNTER — Ambulatory Visit (INDEPENDENT_AMBULATORY_CARE_PROVIDER_SITE_OTHER): Payer: Self-pay | Admitting: Nurse Practitioner

## 2022-05-17 DIAGNOSIS — H1031 Unspecified acute conjunctivitis, right eye: Secondary | ICD-10-CM

## 2022-05-17 MED ORDER — POLYMYXIN B-TRIMETHOPRIM 10000-0.1 UNIT/ML-% OP SOLN: 1.0000 [drp] | OPHTHALMIC | 0 refills | Status: AC

## 2022-05-17 NOTE — Progress Notes (Signed)
  Virtual Visit Consent   Robert Perez, you are scheduled for a virtual visit with a [facility] provider today. Just as with appointments in the office, your consent must be obtained to participate.   I need to obtain your verbal consent now. Are you willing to proceed with your visit today? Robert Perez has provided verbal consent on 05/17/2022 for a virtual visit (video or telephone). [name], FNP  Date: 05/17/2022 9:49 AM  Virtual Visit via Video Note   I, [name], connected with  Robert Perez  ([id number], 12/27/1950) on 05/17/22 at  9:30 AM EST by telephone and verified that I am speaking with the correct person using two identifiers.  Location: Patient: Virtual Visit Location Patient: Home Provider: Virtual Visit Location Provider: Office/Clinic   I discussed the limitations of evaluation and management by telemedicine and the availability of in person appointments. The patient expressed understanding and agreed to proceed.    History of Present Illness: Robert Perez is a 71 y.o. and is being seen today for pink eye. He woke up with his right eye crusted shut. His daughter had pink eye last week. His eye is now red and irritated.  He does not wear contacts or use any other prescriptive eye drops.    Problems:  Patient Active Problem List   Diagnosis Date Noted   Muscle cramping 10/30/2021   HLD (hyperlipidemia) 10/25/2020   History of prostate cancer 10/25/2020   Thumb pain 09/08/2017   Back pain 09/27/2016   Healthcare maintenance 09/27/2016   Medicare annual wellness visit, initial 09/03/2014   Advance care planning 09/03/2014    Allergies:  Allergies  Allergen Reactions   Codeine Nausea Only   Nitroglycerin     headache   Medications:  Current Outpatient Medications:    trimethoprim-polymyxin b (POLYTRIM) ophthalmic solution, Place 1 drop into both eyes every 4 (four) hours while awake for 5 days., Disp: 10 mL, Rfl: 0   tamsulosin (FLOMAX) 0.4 MG CAPS  capsule, Take 1 capsule (0.4 mg total) by mouth daily., Disp: 90 capsule, Rfl: 3  Observations/Objective: No labored breathing.  Speech is clear and coherent with logical content.  Patient is alert and oriented at baseline.    Assessment and Plan: 1. Acute bacterial conjunctivitis of right eye - trimethoprim-polymyxin b (POLYTRIM) ophthalmic solution; Place 1 drop into both eyes every 4 (four) hours while awake for 5 days.  Dispense: 10 mL; Refill: 0    Follow Up Instructions: I discussed the assessment and treatment plan with the patient. The patient was provided an opportunity to ask questions and all were answered. The patient agreed with the plan and demonstrated an understanding of the instructions.    The patient was advised to call back or seek an in-person evaluation if the symptoms worsen or if the condition fails to improve as anticipated.  Time:  I spent 7 minutes with the patient via telehealth technology discussing the above problems/concerns.    [name], FNP

## 2022-07-26 ENCOUNTER — Ambulatory Visit (INDEPENDENT_AMBULATORY_CARE_PROVIDER_SITE_OTHER): Payer: Medicare HMO | Admitting: Family Medicine

## 2022-07-26 ENCOUNTER — Encounter: Payer: Self-pay | Admitting: Family Medicine

## 2022-07-26 VITALS — BP 140/70 | HR 75 | Temp 97.9°F | Ht 72.0 in | Wt 191.5 lb

## 2022-07-26 DIAGNOSIS — R202 Paresthesia of skin: Secondary | ICD-10-CM | POA: Diagnosis not present

## 2022-07-26 NOTE — Assessment & Plan Note (Signed)
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 If related to past trauma  ? If possible neuroma (but no pain) Ref to podiatrist for further eval and tx

## 2022-07-26 NOTE — Progress Notes (Signed)
Subjective:    Patient ID: Robert Perez, male    DOB: 26-Dec-1950, 72 y.o.   MRN: [id number]  HPI 72 yo pt of Dr Damita Dunnings presents for numbness in right foot  He has a h/o prostate cancer   Wt Readings from Last 3 Encounters:  07/26/22 191 lb 8 oz (86.9 kg)  10/27/21 190 lb (86.2 kg)  10/13/21 187 lb (84.8 kg)   25.97 kg/m  Some numbness in R foot -under first toe / some 2,3 rd toes  No tingling Just a lost sensation   No new trauma    This started about 2 years ago after wearing shoes without cushioning at work on concrete floors   Best shoes are sneakers  Work shoes/more stiff make it worse   No ext to ankle or leg  No pain at all   No back problems   5 y clear from prostate cancer   Cycles for exercise- clip on shoes for pedals feel ok to him usually / one pair squeeze a little   Patient Active Problem List   Diagnosis Date Noted   Paresthesia of right foot 07/26/2022   Muscle cramping 10/30/2021   HLD (hyperlipidemia) 10/25/2020   History of prostate cancer 10/25/2020   Thumb pain 09/08/2017   Back pain 09/27/2016   Healthcare maintenance 09/27/2016   Medicare annual wellness visit, initial 09/03/2014   Advance care planning 09/03/2014   Past Medical History:  Diagnosis Date   Allergic rhinitis due to pollen    Prostate cancer [facility]) urologist-  dr ottelin/oncologist-  dr Tammi Klippel   dx 12-05-2016-- Stage T1c,  Gleason 3+4,  PSA 7.78,  vol 33cc   PSA elevation    Wears glasses    Past Surgical History:  Procedure Laterality Date   COLONOSCOPY  2014   CYSTOSCOPY  03/16/2017   Procedure: CYSTOSCOPY;  Surgeon: Kathie Rhodes, MD;  Location: [facility];  Service: Urology;;  no seeds found in bladder   PROSTATE BIOPSY  12-05-2016   dr Karsten Ro office   RADIOACTIVE SEED IMPLANT N/A 03/16/2017   Procedure: RADIOACTIVE SEED IMPLANT/BRACHYTHERAPY IMPLANT WITH SPACE OAR INSTILLATION;  Surgeon: Kathie Rhodes, MD;  Location: [facility];  Service: Urology;  Laterality: N/A;    28 seeds implanted   Social History   Tobacco Use   Smoking status: Never   Smokeless tobacco: Never  Vaping Use   Vaping Use: Never used  Substance Use Topics   Alcohol use: No    Alcohol/week: 0.0 standard drinks of alcohol   Drug use: No   Family History  Problem Relation Age of Onset   Diabetes Mother    Arthritis Father    Hyperlipidemia Father    Hypertension Father    Kidney disease Father    Prostate cancer Father        dx'd late in life.    Breast cancer Sister    Colon cancer Maternal Grandfather    Cancer Brother    Bladder Cancer Brother    Breast cancer Sister    Breast cancer Sister    Pancreatic cancer Neg Hx    Stomach cancer Neg Hx    Allergies  Allergen Reactions   Codeine Nausea Only   Nitroglycerin     headache   Current Outpatient Medications on File Prior to Visit  Medication Sig Dispense Refill   tamsulosin (FLOMAX) 0.4 MG CAPS capsule Take 1 capsule (0.4 mg total) by mouth daily. 90 capsule 3  No current facility-administered medications on file prior to visit.      Review of Systems  Constitutional:  Negative for activity change, appetite change, fatigue, fever and unexpected weight change.  HENT:  Negative for congestion, rhinorrhea, sore throat and trouble swallowing.   Eyes:  Negative for pain, redness, itching and visual disturbance.  Respiratory:  Negative for cough, chest tightness, shortness of breath and wheezing.   Cardiovascular:  Negative for chest pain and palpitations.  Gastrointestinal:  Negative for abdominal pain, blood in stool, constipation, diarrhea and nausea.  Endocrine: Negative for cold intolerance, heat intolerance, polydipsia and polyuria.  Genitourinary:  Negative for difficulty urinating, dysuria, frequency and urgency.  Musculoskeletal:  Negative for arthralgias, joint swelling and myalgias.  Skin:  Negative for pallor and rash.  Neurological:  Positive for  numbness. Negative for dizziness, tremors, weakness and headaches.  Hematological:  Negative for adenopathy. Does not bruise/bleed easily.  Psychiatric/Behavioral:  Negative for decreased concentration and dysphoric mood. The patient is not nervous/anxious.        Objective:   Physical Exam Constitutional:      General: He is not in acute distress.    Appearance: Normal appearance. He is normal weight. He is not ill-appearing.  Eyes:     Conjunctiva/sclera: Conjunctivae normal.     Pupils: Pupils are equal, round, and reactive to light.  Cardiovascular:     Rate and Rhythm: Normal rate and regular rhythm.     Heart sounds: Normal heart sounds.  Pulmonary:     Effort: Pulmonary effort is normal. No respiratory distress.  Musculoskeletal:     Right lower leg: No edema.     Left lower leg: No edema.     Right foot: Normal range of motion and normal capillary refill. Prominent metatarsal heads present. No swelling, Charcot foot, foot drop, tenderness, bony tenderness or crepitus. Normal pulse.     Left foot: Normal.     Comments: Some decreased sensation over plantar R foot (under 1,2,3 MT heads) Nails are mildly thick but well trimmed     Skin:    Coloration: Skin is not pale.     Findings: No bruising, erythema, lesion or rash.  Neurological:     Mental Status: He is alert.     Cranial Nerves: No cranial nerve deficit.     Sensory: Sensory deficit present.     Motor: No weakness.     Gait: Gait normal.     Deep Tendon Reflexes: Reflexes normal.     Comments: Able to support self on toes Nl gait  No weakness  Sensory change in small area of R foot only-see [facility] exam  Psychiatric:        Mood and Affect: Mood normal.           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 If possible neuroma (but no pain) Ref to podiatrist for further eval and tx       Relevant Orders   Ambulatory referral to Podiatry

## 2022-07-26 NOTE — Patient Instructions (Signed)
I put the referral in for podiatry  Please let us know if you don't hear in 1-2 weeks   Wear your most comfortable shoes  If you develop pain or swelling in the meantime please let us know

## 2022-08-10 ENCOUNTER — Ambulatory Visit: Payer: Medicare HMO | Admitting: Podiatry

## 2022-08-10 ENCOUNTER — Encounter: Payer: Self-pay | Admitting: Podiatry

## 2022-08-10 VITALS — BP 155/79 | HR 64

## 2022-08-10 DIAGNOSIS — M7741 Metatarsalgia, right foot: Secondary | ICD-10-CM | POA: Diagnosis not present

## 2022-08-10 NOTE — Progress Notes (Signed)
  Subjective:  Patient ID: Robert Perez, male    DOB: 04/06/1951,  MRN: [id number]  Chief Complaint  Patient presents with   Numbness    "I have numbness on the ball of my foot and in my toes." N - numbness L - right ball of foot and 1st and 2nd toes D - 3.5 - 4 yrs O - gradually worse C - numbness A - none T - went to see Dr. Alba Cory and he referred me here    72 y.o. male presents with the above complaint.  Patient presents with right forefoot pain/metatarsalgia pain.  Patient states that he is having some numbness to the ball of the foot.  Is been on for few years is progressive gotten worse when to get it evaluated he does put a good amount of work on his foot.  He has not seen anyone as prior to seeing me.  Pain scale is 3 out of 10 more achiness.  See above for rest of the HPI   Review of Systems: Negative except as noted in the HPI. Denies N/V/F/Ch.  Past Medical History:  Diagnosis Date   Allergic rhinitis due to pollen    Prostate cancer [facility]) urologist-  dr ottelin/oncologist-  dr Tammi Klippel   dx 12-05-2016-- Stage T1c,  Gleason 3+4,  PSA 7.78,  vol 33cc   PSA elevation    Wears glasses     Current Outpatient Medications:    tamsulosin (FLOMAX) 0.4 MG CAPS capsule, Take 1 capsule (0.4 mg total) by mouth daily., Disp: 90 capsule, Rfl: 3  Social History   Tobacco Use  Smoking Status Never  Smokeless Tobacco Never    Allergies  Allergen Reactions   Codeine Nausea Only   Nitroglycerin     headache   Objective:   Vitals:   08/10/22 1322  BP: (!) 155/79  Pulse: 64   There is no height or weight on file to calculate BMI. Constitutional Well developed. Well nourished.  Vascular Dorsalis pedis pulses palpable bilaterally. Posterior tibial pulses palpable bilaterally. Capillary refill normal to all digits.  No cyanosis or clubbing noted. Pedal hair growth normal.  Neurologic Normal speech. Oriented to person, place, and time. Epicritic sensation to light  touch grossly present bilaterally.  Dermatologic Nails well groomed and normal in appearance. No open wounds. No skin lesions.  Orthopedic: Negative Mulder's click or neuroma symptoms noted negative capsulitis symptoms noted.  Generalized pain noted across the ball of the foot likely consistent with metatarsalgia.  Negative Tinel's sign noted   Radiographs: None Assessment:   1. Metatarsalgia, right foot    Plan:  Patient was evaluated and treated and all questions answered.  Right forefoot metatarsalgia -All questions and concerns were discussed with the patient in extensive detail.  I discussed with them in extensive detail that this is likely due to shoe gear modification that is needed.  I encouraged him to wear proper and good shoes.  Also discussed orthotics option if any foot and ankle issues arise in future he will come back and see me.  No follow-ups on file.

## 2022-09-21 DIAGNOSIS — H524 Presbyopia: Secondary | ICD-10-CM | POA: Diagnosis not present

## 2022-11-10 DIAGNOSIS — Z0100 Encounter for examination of eyes and vision without abnormal findings: Secondary | ICD-10-CM | POA: Diagnosis not present

## 2022-11-15 ENCOUNTER — Other Ambulatory Visit: Payer: Self-pay | Admitting: Family Medicine

## 2022-11-15 NOTE — Telephone Encounter (Signed)
Patient has been scheduled

## 2022-11-15 NOTE — Telephone Encounter (Signed)
Refill request for TAMSULOSIN HYDROCHLORIDE 0.4 MG Capsule   LOV - 10/27/21 Next OV - not scheduled yet Last refill - 10/27/21 #90/3

## 2022-11-15 NOTE — Telephone Encounter (Signed)
Patient needs f/u with Dr. Para March; please call to schedule appt.

## 2022-11-24 ENCOUNTER — Encounter: Payer: Self-pay | Admitting: Family Medicine

## 2022-11-24 ENCOUNTER — Ambulatory Visit (INDEPENDENT_AMBULATORY_CARE_PROVIDER_SITE_OTHER): Payer: Medicare HMO | Admitting: Family Medicine

## 2022-11-24 VITALS — BP 120/60 | HR 79 | Temp 98.5°F | Ht 72.0 in | Wt 191.0 lb

## 2022-11-24 DIAGNOSIS — E785 Hyperlipidemia, unspecified: Secondary | ICD-10-CM | POA: Diagnosis not present

## 2022-11-24 DIAGNOSIS — Z8546 Personal history of malignant neoplasm of prostate: Secondary | ICD-10-CM | POA: Diagnosis not present

## 2022-11-24 DIAGNOSIS — Z0001 Encounter for general adult medical examination with abnormal findings: Secondary | ICD-10-CM | POA: Diagnosis not present

## 2022-11-24 DIAGNOSIS — R198 Other specified symptoms and signs involving the digestive system and abdomen: Secondary | ICD-10-CM

## 2022-11-24 DIAGNOSIS — Z7189 Other specified counseling: Secondary | ICD-10-CM

## 2022-11-24 DIAGNOSIS — Z0000 Encounter for general adult medical examination without abnormal findings: Secondary | ICD-10-CM

## 2022-11-24 NOTE — Patient Instructions (Addendum)
I would get a flu shot each fall.   See if you can get a tetanus shot at work.  GI should call you at the end of the year.  Please call them if they don't call you. [phone]  Go to the lab on the way out.   If you have mychart we'll likely use that to update you.    Take care.  Glad to see you. Check with the dental clinic in the meantime.

## 2022-11-24 NOTE — Progress Notes (Unsigned)
Prev urology imaging d/w pt. still taking Flomax and doing well.  Urinary symptoms controlled.  IMPRESSION: 1. Well-circumscribed 11 mm left upper pole renal lesion corresponding with the indeterminate lesion on prior CT is consistent with a benign Bosniak classification 2 renal cyst requiring no additional imaging follow-up. 2. Additional 2.3 cm left upper pole renal cyst is also benign and requires no additional imaging follow-up. 3. No solid enhancing renal mass.   He is still playing bass > keyboard.  He enjoys both.      He is cycling when weather allows.  Otherwise he is at the gym.    covid vaccine 2021 Tetanus shot d/w pt.  He can get that at work. PNA vaccine UTD.   shingrix d/w pt.   Flu shot yearly Normal colonoscopy 2014, d/w pt.  PSA pending 2024 Living will d/w pt- his daughter would be designated if patient were incapacitated.   Recheck lipids pending.  See notes on labs.  He had noted sticky clear gel on the gumline, upper and lower teeth.  He talked to dentist, had routine cleaning.  No change with toothpaste change.  I don't have an explanation.  Discussed.   Meds, vitals, and allergies reviewed.    ROS: Per HPI unless specifically indicated in ROS section    GEN: nad, alert and oriented HEENT: ncat NECK: supple w/o LA CV: rrr. PULM: ctab, no inc wob ABD: soft, +bs EXT: no edema SKIN: no acute rash Gumline appears normal without any lesions or exudates seen. No other oral lesions.  No thrush.  Tongue appears normal.  25 minutes were devoted to patient care in this encounter (this includes time spent reviewing the patient's file/history, interviewing and examining the patient, counseling/reviewing plan with patient).

## 2022-11-25 LAB — COMPREHENSIVE METABOLIC PANEL
AG Ratio: 1.7 (calc) (ref 1.0–2.5)
ALT: 13 U/L (ref 9–46)
AST: 16 U/L (ref 10–35)
Albumin: 4.3 g/dL (ref 3.6–5.1)
Alkaline phosphatase (APISO): 50 U/L (ref 35–144)
BUN: 21 mg/dL (ref 7–25)
CO2: 25 mmol/L (ref 20–32)
Calcium: 9.6 mg/dL (ref 8.6–10.3)
Chloride: 103 mmol/L (ref 98–110)
Creat: 1.11 mg/dL (ref 0.70–1.28)
Globulin: 2.5 g/dL (calc) (ref 1.9–3.7)
Glucose, Bld: 120 mg/dL — ABNORMAL HIGH (ref 65–99)
Potassium: 4.7 mmol/L (ref 3.5–5.3)
Sodium: 141 mmol/L (ref 135–146)
Total Bilirubin: 0.5 mg/dL (ref 0.2–1.2)
Total Protein: 6.8 g/dL (ref 6.1–8.1)

## 2022-11-25 LAB — LIPID PANEL
Cholesterol: 232 mg/dL — ABNORMAL HIGH (ref <200)
HDL: 70 mg/dL (ref >=40)
LDL Cholesterol (Calc): 135 mg/dL (calc) — ABNORMAL HIGH
Non-HDL Cholesterol (Calc): 162 mg/dL (calc) — ABNORMAL HIGH (ref <130)
Total CHOL/HDL Ratio: 3.3 (calc) (ref <5.0)
Triglycerides: 143 mg/dL (ref <150)

## 2022-11-25 LAB — PSA: PSA: 0.14 ng/mL (ref <=4.00)

## 2022-11-26 DIAGNOSIS — R198 Other specified symptoms and signs involving the digestive system and abdomen: Secondary | ICD-10-CM | POA: Insufficient documentation

## 2022-11-26 NOTE — Assessment & Plan Note (Signed)
Recheck PSA pending.  See notes on labs.  Continue Flomax.

## 2022-11-26 NOTE — Assessment & Plan Note (Signed)
I do not see any abnormality and I am unable to explain his symptoms.  I asked him to check with the dental clinic and update me as needed.

## 2022-11-26 NOTE — Assessment & Plan Note (Signed)
Living will d/w pt- his daughter would be designated if patient were incapacitated.  

## 2022-11-26 NOTE — Assessment & Plan Note (Signed)
covid vaccine 2021 Tetanus shot d/w pt.  He can get that at work. PNA vaccine UTD.   shingrix d/w pt.   Flu shot yearly Normal colonoscopy 2014, d/w pt.  PSA pending 2024 Living will d/w pt- his daughter would be designated if patient were incapacitated.

## 2023-02-20 ENCOUNTER — Ambulatory Visit (INDEPENDENT_AMBULATORY_CARE_PROVIDER_SITE_OTHER): Payer: Medicare HMO

## 2023-02-20 VITALS — Ht 72.0 in | Wt 180.0 lb

## 2023-02-20 DIAGNOSIS — Z0000 Encounter for general adult medical examination without abnormal findings: Secondary | ICD-10-CM | POA: Diagnosis not present

## 2023-02-20 NOTE — Progress Notes (Signed)
Subjective:   Robert Perez is a 72 y.o. male who presents for Medicare Annual/Subsequent preventive examination.  Visit Complete: Virtual  I connected with  Sim Boast on 02/20/23 by a audio enabled telemedicine application and verified that I am speaking with the correct person using two identifiers.  Patient Location: Home  Provider Location: Home Office  I discussed the limitations of evaluation and management by telemedicine. The patient expressed understanding and agreed to proceed.  Patient Medicare AWV questionnaire was completed by the patient on 02/20/2023; I have confirmed that all information answered by patient is correct and no changes since this date. Vital Signs: Unable to obtain new vitals due to this being a telehealth visit.  Cardiac Risk Factors include: advanced age (>40men, >5 women);male gender     Objective:    Today's Vitals   02/20/23 1336  Weight: 180 lb (81.6 kg)  Height: 6' (1.829 m)   Body mass index is 24.41 kg/m.     02/20/2023    1:39 PM 10/13/2021    5:19 PM 10/12/2020    2:42 PM 10/10/2019    2:41 PM 09/30/2018    4:12 PM 09/20/2017   12:58 PM 03/16/2017    7:51 AM  Advanced Directives  Does Patient Have a Medical Advance Directive? Yes No No No No No No  Type of Estate agent of [place];Living will        Copy of Healthcare Power of Attorney in Chart? No - copy requested        Would patient like information on creating a medical advance directive?  No - Patient declined No - Patient declined No - Patient declined No - Patient declined Yes (MAU/Ambulatory/Procedural Areas - Information given) No - Patient declined    Current Medications (verified) Outpatient Encounter Medications as of 02/20/2023  Medication Sig   tamsulosin (FLOMAX) 0.4 MG CAPS capsule TAKE 1 CAPSULE EVERY DAY   No facility-administered encounter medications on file as of 02/20/2023.    Allergies (verified) Codeine and Nitroglycerin    History: Past Medical History:  Diagnosis Date   Allergic rhinitis due to pollen    Prostate cancer [facility]) urologist-  dr ottelin/oncologist-  dr Kathrynn Running   dx 12-05-2016-- Stage T1c,  Gleason 3+4,  PSA 7.78,  vol 33cc   PSA elevation    Wears glasses    Past Surgical History:  Procedure Laterality Date   COLONOSCOPY  2014   CYSTOSCOPY  03/16/2017   Procedure: CYSTOSCOPY;  Surgeon: Ihor Gully, MD;  Location: [facility];  Service: Urology;;  no seeds found in bladder   PROSTATE BIOPSY  12-05-2016   dr Vernie Ammons office   RADIOACTIVE SEED IMPLANT N/A 03/16/2017   Procedure: RADIOACTIVE SEED IMPLANT/BRACHYTHERAPY IMPLANT WITH SPACE OAR INSTILLATION;  Surgeon: Ihor Gully, MD;  Location: [facility];  Service: Urology;  Laterality: N/A;    68 seeds implanted   Family History  Problem Relation Age of Onset   Diabetes Mother    Arthritis Father    Hyperlipidemia Father    Hypertension Father    Kidney disease Father    Prostate cancer Father        dx'd late in life.    Breast cancer Sister    Colon cancer Maternal Grandfather    Cancer Brother    Bladder Cancer Brother    Breast cancer Sister    Breast cancer Sister    Pancreatic cancer Neg Hx    Stomach cancer  Neg Hx    Social History   Socioeconomic History   Marital status: Single    Spouse name: Not on file   Number of children: Not on file   Years of education: Not on file   Highest education level: Not on file  Occupational History   Occupation: retired  Tobacco Use   Smoking status: Never   Smokeless tobacco: Never  Vaping Use   Vaping status: Never Used  Substance and Sexual Activity   Alcohol use: No    Alcohol/week: 0.0 standard drinks of alcohol   Drug use: No   Sexual activity: Never  Other Topics Concern   Not on file  Social History Narrative   Lifelong single.   Has one adult daughter.  Daughter is working in [place] for [employer].    [place] since 2004.  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  Working part time with mail service at [employer]   Likely to cycle, [address] per week   Plays bass guitar, keyboards.     Social Determinants of Health   Financial Resource Strain: Low Risk  (02/20/2023)   Overall Financial Resource Strain (CARDIA)    Difficulty of Paying Living Expenses: Not hard at all  Food Insecurity: No Food Insecurity (02/20/2023)   Hunger Vital Sign    Worried About Running Out of Food in the Last Year: Never true    Ran Out of Food in the Last Year: Never true  Transportation Needs: No Transportation Needs (02/20/2023)   PRAPARE - Administrator, Civil Service (Medical): No    Lack of Transportation (Non-Medical): No  Physical Activity: Sufficiently Active (02/20/2023)   Exercise Vital Sign    Days of Exercise per Week: 5 days    Minutes of Exercise per Session: 60 min  Stress: No Stress Concern Present (02/20/2023)   [employer] of Occupational Health - Occupational Stress Questionnaire    Feeling of Stress : Not at all  Social Connections: Moderately Integrated (02/20/2023)   Social Connection and Isolation Panel [NHANES]    Frequency of Communication with Friends and Family: More than three times a week    Frequency of Social Gatherings with Friends and Family: More than three times a week    Attends Religious Services: More than 4 times per year    Active Member of [employer] or Organizations: Yes    Attends Engineer, structural: More than 4 times per year    Marital Status: Never married    Tobacco Counseling Counseling given: Not Answered   Clinical Intake:  Pre-visit preparation completed: Yes  Pain : No/denies pain     Nutritional Risks: None Diabetes: No  How often do you need to have someone help you when you read instructions, pamphlets, or other written materials from your doctor or pharmacy?: 1 - Never  Interpreter Needed?: No  Information entered by :: Renie Ora,  LPN   Activities of Daily Living    02/20/2023    1:40 PM  In your present state of health, do you have any difficulty performing the following activities:  Hearing? 0  Vision? 0  Difficulty concentrating or making decisions? 0  Walking or climbing stairs? 0  Dressing or bathing? 0  Doing errands, shopping? 0  Preparing Food and eating ? N  Using the Toilet? N  In the past six months, have you accidently leaked urine? N  Do you have problems with loss of bowel control? N  Managing your Medications?  N  Managing your Finances? N  Housekeeping or managing your Housekeeping? N    Patient Care Team: Joaquim Nam, MD as PCP - General (Family Medicine) Jannifer Hick, MD as Consulting Physician (Urology) Candelaria Stagers, DPM as Consulting Physician (Podiatry)  Indicate any recent Medical Services you may have received from other than [facility] providers in the past year (date may be approximate).     Assessment:   This is a routine wellness examination for [place].  Hearing/Vision screen Vision Screening - Comments:: Wears rx glasses - up to date with routine eye exams with  Dr. Alexia [facility]    Goals Addressed             This Visit's Progress    Patient Stated   On track    10/13/2021 I will continue to cycle 4 days a week for 15 [place]- [address] [place].       Depression Screen    02/20/2023    1:38 PM 11/24/2022    2:10 PM 10/13/2021    5:17 PM 10/12/2020    2:43 PM 10/10/2019    2:42 PM 09/30/2018    8:34 AM 09/20/2017   12:52 PM  PHQ 2/9 Scores  PHQ - 2 Score 0 0 0 0 0 0 0  PHQ- 9 Score  0  0 0 0 0    Fall Risk    02/20/2023    1:37 PM 11/24/2022    2:10 PM 10/13/2021    5:15 PM 10/12/2020    2:42 PM 10/10/2019    2:42 PM  Fall Risk   Falls in the past year? 0 0 0 0 0  Number falls in past yr: 0 0 0 0 0  Injury with Fall? 0 0 0 0 0  Risk for fall due to : No Fall Risks No Fall Risks No Fall Risks No Fall Risks No Fall Risks  Follow up Falls prevention discussed Falls  evaluation completed Falls prevention discussed Falls evaluation completed;Falls prevention discussed Falls evaluation completed;Falls prevention discussed    MEDICARE RISK AT HOME: Medicare Risk at Home Any stairs in or around the home?: Yes If so, are there any without handrails?: No Home free of loose throw rugs in walkways, pet beds, electrical cords, etc?: Yes Adequate lighting in your home to reduce risk of falls?: Yes Life alert?: No Use of a cane, walker or w/c?: No Grab bars in the bathroom?: Yes Shower chair or bench in shower?: Yes Elevated toilet seat or a handicapped toilet?: Yes  TIMED UP AND GO:  Was the test performed?  No    Cognitive Function:    10/12/2020    2:45 PM 10/10/2019    2:46 PM 09/30/2018    8:35 AM 09/20/2017   12:53 PM 09/19/2016    1:08 PM  MMSE - Mini Mental State Exam  Orientation to time 5 5 5 5 5   Orientation to Place 5 5 5 5 5   Registration 3 3 3 3 3   Attention/ Calculation 5 5 0 0 0  Recall 3 3 3 3 3   Language- name 2 objects   0 0 0  Language- repeat 1 1 1 1 1   Language- follow 3 step command   0 3 3  Language- read & follow direction   0 0 0  Write a sentence   0 0 0  Copy design   0 0 0  Total score   17 20 20  02/20/2023    1:40 PM 10/13/2021    5:20 PM  6CIT Screen  What Year? 0 points 0 points  What month? 0 points 0 points  What time? 0 points 0 points  Count back from 20 0 points 0 points  Months in reverse 0 points 2 points  Repeat phrase 0 points 8 points  Total Score 0 points 10 points    Immunizations Immunization History  Administered Date(s) Administered   Influenza,inj,Quad PF,6+ Mos 04/02/2018, 03/14/2019   Influenza-Unspecified 02/08/2016, 03/27/2017, 04/02/2018, 03/24/2020   PFIZER(Purple Top)SARS-COV-2 Vaccination 07/20/2019, 08/12/2019, 03/01/2020, 02/28/2021   Pneumococcal Conjugate-13 09/03/2014   Pneumococcal Polysaccharide-23 09/07/2015   Zoster, Live 10/23/2014    TDAP status: Due,  Education has been provided regarding the importance of this vaccine. Advised may receive this vaccine at local pharmacy or Health Dept. Aware to provide a copy of the vaccination record if obtained from local pharmacy or Health Dept. Verbalized acceptance and understanding.  Flu Vaccine status: Up to date  Pneumococcal vaccine status: Up to date  Covid-19 vaccine status: Completed vaccines  Qualifies for Shingles Vaccine? Yes   Zostavax completed No   Shingrix Completed?: No.    Education has been provided regarding the importance of this vaccine. Patient has been advised to call insurance company to determine out of pocket expense if they have not yet received this vaccine. Advised may also receive vaccine at local pharmacy or Health Dept. Verbalized acceptance and understanding.  Screening Tests Health Maintenance  Topic Date Due   DTaP/Tdap/Td (1 - Tdap) Never done   Zoster Vaccines- Shingrix (1 of 2) 01/12/1999   INFLUENZA VACCINE  12/28/2022   COVID-19 Vaccine (5 - 2023-24 season) 01/28/2023   Colonoscopy  05/03/2023   Medicare Annual Wellness (AWV)  02/20/2024   Pneumonia Vaccine 43+ Years old  Completed   Hepatitis C Screening  Completed   HPV VACCINES  Aged Out    Health Maintenance  Health Maintenance Due  Topic Date Due   DTaP/Tdap/Td (1 - Tdap) Never done   Zoster Vaccines- Shingrix (1 of 2) 01/12/1999   INFLUENZA VACCINE  12/28/2022   COVID-19 Vaccine (5 - 2023-24 season) 01/28/2023   Colonoscopy  05/03/2023    Colorectal cancer screening: Type of screening: Colonoscopy. Completed 05/02/2013. Repeat every 10 years  Lung Cancer Screening: (Low Dose CT Chest recommended if Age 68-80 years, 20 pack-year currently smoking OR have quit w/in 15years.) does not qualify.   Lung Cancer Screening Referral: n/a   Additional Screening:  Hepatitis C Screening: does not qualify; Completed 09/07/2015  Vision Screening: Recommended annual ophthalmology exams for early  detection of glaucoma and other disorders of the eye. Is the patient up to date with their annual eye exam?  Yes  Who is the provider or what is the name of the office in which the patient attends annual eye exams? [facility]  If pt is not established with a provider, would they like to be referred to a provider to establish care? No .   Dental Screening: Recommended annual dental exams for proper oral hygiene   Community Resource Referral / Chronic Care Management: CRR required this visit?  No   CCM required this visit?  No     Plan:     I have personally reviewed and noted the following in the patient's chart:   Medical and social history Use of alcohol, tobacco or illicit drugs  Current medications and supplements including opioid prescriptions. Patient is not currently taking opioid prescriptions.  Functional ability and status Nutritional status Physical activity Advanced directives List of other physicians Hospitalizations, surgeries, and ER visits in previous 12 months Vitals Screenings to include cognitive, depression, and falls Referrals and appointments  In addition, I have reviewed and discussed with patient certain preventive protocols, quality metrics, and best practice recommendations. A written personalized care plan for preventive services as well as general preventive health recommendations were provided to patient.     Lorrene Reid, LPN   1/61/0960   After Visit Summary: (MyChart) Due to this being a telephonic visit, the after visit summary with patients personalized plan was offered to patient via MyChart   Nurse Notes: Due TdaP Vaccine

## 2023-02-20 NOTE — Patient Instructions (Signed)
Robert Perez , Thank you for taking time to come for your Medicare Wellness Visit. I appreciate your ongoing commitment to your health goals. Please review the following plan we discussed and let me know if I can assist you in the future.   Referrals/Orders/Follow-Ups/Clinician Recommendations: Aim for 30 minutes of exercise or brisk walking, 6-8 glasses of water, and 5 servings of fruits and vegetables each day.   This is a list of the screening recommended for you and due dates:  Health Maintenance  Topic Date Due   DTaP/Tdap/Td vaccine (1 - Tdap) Never done   Zoster (Shingles) Vaccine (1 of 2) 01/12/1999   Flu Shot  12/28/2022   COVID-19 Vaccine (5 - 2023-24 season) 01/28/2023   Colon Cancer Screening  05/03/2023   Medicare Annual Wellness Visit  02/20/2024   Pneumonia Vaccine  Completed   Hepatitis C Screening  Completed   HPV Vaccine  Aged Out    Advanced directives: (Copy Requested) Please bring a copy of your health care power of attorney and living will to the office to be added to your chart at your convenience.  Next Medicare Annual Wellness Visit scheduled for next year: Yes  insert Preventive Care attachment Insert FALL PREVENTION attachment if needed

## 2023-03-30 LAB — FECAL OCCULT BLOOD, IMMUNOCHEMICAL: IFOBT: NEGATIVE

## 2023-04-23 ENCOUNTER — Encounter: Payer: Self-pay | Admitting: Family Medicine

## 2023-09-05 ENCOUNTER — Other Ambulatory Visit: Payer: Self-pay | Admitting: Family Medicine

## 2023-09-05 NOTE — Telephone Encounter (Signed)
 LOV: 11/24/22 [id number] SCHEDULED LAST REFILL:  tamsulosin (FLOMAX) 0.4 MG CAPS capsule 90 CAPSULES 3-RF

## 2023-10-19 ENCOUNTER — Ambulatory Visit (INDEPENDENT_AMBULATORY_CARE_PROVIDER_SITE_OTHER): Admitting: Family Medicine

## 2023-10-19 ENCOUNTER — Encounter: Payer: Self-pay | Admitting: Family Medicine

## 2023-10-19 VITALS — BP 134/80 | HR 60 | Temp 98.3°F | Ht 72.0 in | Wt 189.0 lb

## 2023-10-19 DIAGNOSIS — Z7189 Other specified counseling: Secondary | ICD-10-CM

## 2023-10-19 DIAGNOSIS — R399 Unspecified symptoms and signs involving the genitourinary system: Secondary | ICD-10-CM | POA: Diagnosis not present

## 2023-10-19 DIAGNOSIS — Z125 Encounter for screening for malignant neoplasm of prostate: Secondary | ICD-10-CM | POA: Diagnosis not present

## 2023-10-19 DIAGNOSIS — Z8546 Personal history of malignant neoplasm of prostate: Secondary | ICD-10-CM | POA: Diagnosis not present

## 2023-10-19 DIAGNOSIS — R198 Other specified symptoms and signs involving the digestive system and abdomen: Secondary | ICD-10-CM | POA: Diagnosis not present

## 2023-10-19 DIAGNOSIS — E785 Hyperlipidemia, unspecified: Secondary | ICD-10-CM | POA: Diagnosis not present

## 2023-10-19 DIAGNOSIS — Z0000 Encounter for general adult medical examination without abnormal findings: Secondary | ICD-10-CM

## 2023-10-19 LAB — LIPID PANEL
Cholesterol: 238 mg/dL — ABNORMAL HIGH (ref 0–200)
HDL: 72.7 mg/dL (ref >39.00)
LDL Cholesterol: 149 mg/dL — ABNORMAL HIGH (ref 0–99)
NonHDL: 165.23
Total CHOL/HDL Ratio: 3
Triglycerides: 83 mg/dL (ref 0.0–149.0)
VLDL: 16.6 mg/dL (ref 0.0–40.0)

## 2023-10-19 LAB — COMPREHENSIVE METABOLIC PANEL WITH GFR
ALT: 14 U/L (ref 0–53)
AST: 15 U/L (ref 0–37)
Albumin: 4.6 g/dL (ref 3.5–5.2)
Alkaline Phosphatase: 52 U/L (ref 39–117)
BUN: 19 mg/dL (ref 6–23)
CO2: 32 meq/L (ref 19–32)
Calcium: 10.1 mg/dL (ref 8.4–10.5)
Chloride: 101 meq/L (ref 96–112)
Creatinine, Ser: 0.97 mg/dL (ref 0.40–1.50)
GFR: 76.76 mL/min (ref >60.00)
Glucose, Bld: 98 mg/dL (ref 70–99)
Potassium: 4.9 meq/L (ref 3.5–5.1)
Sodium: 138 meq/L (ref 135–145)
Total Bilirubin: 0.7 mg/dL (ref 0.2–1.2)
Total Protein: 7.2 g/dL (ref 6.0–8.3)

## 2023-10-19 LAB — PSA, MEDICARE: PSA: 0.16 ng/mL (ref 0.10–4.00)

## 2023-10-19 MED ORDER — TAMSULOSIN HCL 0.4 MG PO CAPS: 0.4000 mg | ORAL_CAPSULE | Freq: Every day | ORAL | 3 refills | Status: AC

## 2023-10-19 NOTE — Patient Instructions (Addendum)
 Tetanus shot when possible.  RSV vaccine after age 75.   I would get a flu shot each fall.    Go to the lab on the way out.   If you have mychart we'll likely use that to update you.    Take care.  Glad to see you.

## 2023-10-19 NOTE — Progress Notes (Unsigned)
 covid vaccine 2021 Tetanus shot d/w pt.  He can get that at work. PNA vaccine UTD.   shingrix d/w pt.   Flu shot yearly RSV d/w pt.   Normal colonoscopy 2014, d/w pt about options. He had neg IFOB in 2024.  I need to make arrangements for that in fall 2025.   PSA pending 2025 Living will d/w pt- his daughter would be designated if patient were incapacitated.   He changed toothpastes and prev sx got some better.    LUTS controlled with flomax .  No ADE on med.  No blood in urine.  No no fevers.  No chills.  Not leaking but some urgency.    Still cycling.  Doing well with that. Still walking for exercise.    3 episodes of vomiting.  1st episode, unclear if it was related to the food that he ate.  It resolved.   2nd episode about 1 month ago.  He thought that was food related.  He clearly felt better after vomiting.  No vomiting the next day.    1 week ago, he was doing yard work outside after eating a snack, then came back in and felt nausea.  He hasn't had much fluid intake that day.  Then sx resolved after 2 days.  No sx now.  No blood in stool.    Meds, vitals, and allergies reviewed.   ROS: Per HPI unless specifically indicated in ROS section

## 2023-10-22 ENCOUNTER — Ambulatory Visit: Payer: Self-pay | Admitting: Family Medicine

## 2023-10-22 DIAGNOSIS — R198 Other specified symptoms and signs involving the digestive system and abdomen: Secondary | ICD-10-CM | POA: Insufficient documentation

## 2023-10-22 DIAGNOSIS — R399 Unspecified symptoms and signs involving the genitourinary system: Secondary | ICD-10-CM | POA: Insufficient documentation

## 2023-10-22 NOTE — Assessment & Plan Note (Signed)
 covid vaccine 2021 Tetanus shot d/w pt.  He can get that at work. PNA vaccine UTD.   shingrix d/w pt.   Flu shot yearly RSV d/w pt.   Normal colonoscopy 2014, d/w pt about options. He had neg IFOB in 2024.  I made a note in his chart to make arrangements for that in fall 2025.   PSA pending 2025 Living will d/w pt- his daughter would be designated if patient were incapacitated.

## 2023-10-22 NOTE — Assessment & Plan Note (Signed)
 2 of these episodes could been fluid related, the other one could have been related to relative dehydration/heat exposure working outside.  No symptoms now.  Routine cautions given to patient.  I think it makes sense to stay well-hydrated and observe and update me as needed.  He agrees to plan.

## 2023-10-22 NOTE — Assessment & Plan Note (Signed)
 Symptoms controlled with Flomax , except for some urgency.  He can continue as is.

## 2023-10-22 NOTE — Assessment & Plan Note (Signed)
Living will d/w pt- his daughter would be designated if patient were incapacitated.  

## 2024-01-17 LAB — FECAL OCCULT BLOOD, IMMUNOCHEMICAL: IFOBT: NEGATIVE

## 2024-01-29 ENCOUNTER — Ambulatory Visit

## 2024-01-30 ENCOUNTER — Other Ambulatory Visit: Payer: Self-pay | Admitting: Family Medicine

## 2024-02-08 ENCOUNTER — Encounter: Payer: Self-pay | Admitting: Family Medicine

## 2024-03-02 ENCOUNTER — Other Ambulatory Visit: Payer: Self-pay | Admitting: Family Medicine

## 2024-03-02 DIAGNOSIS — Z1211 Encounter for screening for malignant neoplasm of colon: Secondary | ICD-10-CM

## 2024-03-02 NOTE — Progress Notes (Signed)
 Please send IFOB kit to patient and notify him about sending that back.  We talked about it at previous office visit.  Thanks.

## 2024-03-11 ENCOUNTER — Other Ambulatory Visit (INDEPENDENT_AMBULATORY_CARE_PROVIDER_SITE_OTHER): Payer: Self-pay

## 2024-03-11 DIAGNOSIS — Z1211 Encounter for screening for malignant neoplasm of colon: Secondary | ICD-10-CM | POA: Diagnosis not present

## 2024-03-12 LAB — FECAL OCCULT BLOOD, IMMUNOCHEMICAL: Fecal Occult Bld: NEGATIVE

## 2024-03-14 ENCOUNTER — Other Ambulatory Visit: Payer: Self-pay

## 2024-03-14 ENCOUNTER — Emergency Department
Admission: EM | Admit: 2024-03-14 | Discharge: 2024-03-14 | Disposition: A | Discharge status: Home / Self Care | Attending: Emergency Medicine | Admitting: Emergency Medicine

## 2024-03-14 DIAGNOSIS — W540XXA Bitten by dog, initial encounter: Secondary | ICD-10-CM | POA: Insufficient documentation

## 2024-03-14 DIAGNOSIS — Z8546 Personal history of malignant neoplasm of prostate: Secondary | ICD-10-CM | POA: Insufficient documentation

## 2024-03-14 DIAGNOSIS — Z23 Encounter for immunization: Secondary | ICD-10-CM | POA: Insufficient documentation

## 2024-03-14 DIAGNOSIS — S91351A Open bite, right foot, initial encounter: Secondary | ICD-10-CM | POA: Diagnosis not present

## 2024-03-14 MED ORDER — TETANUS-DIPHTH-ACELL PERTUSSIS 5-2-15.5 LF-MCG/0.5 IM SUSP
0.5000 mL | Freq: Once | INTRAMUSCULAR | Status: AC
  Administered 2024-03-14: 0.5 mL via INTRAMUSCULAR
  Filled 2024-03-14: qty 0.5

## 2024-03-14 MED ORDER — AMOXICILLIN-POT CLAVULANATE 875-125 MG PO TABS: 1.0000 | ORAL_TABLET | Freq: Two times a day (BID) | ORAL | 0 refills | Status: AC

## 2024-03-14 MED ORDER — AMOXICILLIN-POT CLAVULANATE 875-125 MG PO TABS
1.0000 | ORAL_TABLET | Freq: Once | ORAL | Status: AC
  Administered 2024-03-14: 1 via ORAL
  Filled 2024-03-14: qty 1

## 2024-03-14 NOTE — ED Triage Notes (Signed)
 Patient states dog bite to right foot; unknown information about the pit bull but did call animal control.

## 2024-03-14 NOTE — ED Provider Notes (Signed)
 [facility] Provider Note    Event Date/Time   First MD Initiated Contact with Patient 03/14/24 1537     (approximate)   History   Animal Bite   HPI  Robert Perez is a 73 y.o. male  with a past medical history of lipidemia, history of prostate cancer presents to the emergency department with dog bite to the lateral aspect of his right foot, specifically a pitbull that happened around 2 PM today.  Patient states he was cycling outside when the pitbull came up and unprovoked attacked and bit him in the side of his right foot.  Denies fever or chills, puslike discharge, numbness or tingling.  Denies any other injuries.  Patient has contacted animal control, but is unsure about the animal's vaccination status or if the dog is in quarantine.  Unsure of last tetanus vaccine.   Physical Exam   Triage Vital Signs: ED Triage Vitals  Encounter Vitals Group     BP 03/14/24 1531 (!) 154/98     Girls Systolic BP Percentile --      Girls Diastolic BP Percentile --      Boys Systolic BP Percentile --      Boys Diastolic BP Percentile --      Pulse Rate 03/14/24 1531 97     Resp 03/14/24 1531 18     Temp 03/14/24 1531 97.9 F (36.6 C)     Temp Source 03/14/24 1531 Oral     SpO2 03/14/24 1531 100 %     Weight --      Height --      Head Circumference --      Peak Flow --      Pain Score 03/14/24 1532 0     Pain Loc --      Pain Education --      Exclude from Growth Chart --     Most recent vital signs: Vitals:   03/14/24 1531 03/14/24 1641  BP: (!) 154/98 134/87  Pulse: 97   Resp: 18   Temp: 97.9 F (36.6 C)   SpO2: 100%     General: Awake, in no acute distress. Appears stated age. CV: Good peripheral perfusion. DP pulses 2+ b/l. Respiratory:Normal respiratory effort.  No respiratory distress.  GI: Soft, non-distended. MSK: Normal ROM and  5/5 strength in b/l ankles and toes.  Skin:Warm, dry. 2 puncture wounds to lateral aspect of right foot along  5th proximal phalanx and 5th metatarsal, bleeding controlled. Neurological: A&Ox4 to person, place, time, and situation. Sensation intact. Strength symmetric. No focal deficits.   ED Results / Procedures / Treatments   Labs (all labs ordered are listed, but only abnormal results are displayed) Labs Reviewed - No data to display   EKG     RADIOLOGY    PROCEDURES:  Critical Care performed: No   Procedures   MEDICATIONS ORDERED IN ED: Medications  amoxicillin -clavulanate (AUGMENTIN ) 875-125 MG per tablet 1 tablet (1 tablet Oral Given 03/14/24 1606)  Tdap (ADACEL) injection 0.5 mL (0.5 mLs Intramuscular Given 03/14/24 1623)     IMPRESSION / MDM / ASSESSMENT AND PLAN / ED COURSE  I reviewed the triage vital signs and the nursing notes.                              Differential diagnosis includes, but is not limited to, animal bite, cellulitis, laceration  Patient's presentation is most consistent with  acute, uncomplicated illness.  Patient is a 73 year old male with signs and symptoms as described above.  Patient is well-appearing, afebrile.  No signs of cellulitis.  Had RN Olam Norman contact animal control who reported that the dog was now at home and quarantine for 10 days although vaccination status is still unknown.  No need for rabies injections at this time.  Told patient to follow-up with animal control next week to ensure the dog does not develop symptoms of rabies.  Tdap was updated.  Patient given 1 dose of Augmentin  here and the remainder will be sent to the pharmacy of their choice.  Wound care provided by RN and Runner, broadcasting/film/video.  Wound care instructions discussed with the patient. Patient should follow-up with their primary care provider following this visit.   The patient may return to the emergency department for any new, worsening, or concerning symptoms. Patient was given the opportunity to ask questions; all questions were answered. Emergency department return  precautions were discussed with the patient.  Patient is in agreement to the treatment plan.  Patient is stable for discharge.   FINAL CLINICAL IMPRESSION(S) / ED DIAGNOSES   Final diagnoses:  Dog bite of foot, right, initial encounter     Rx / DC Orders   ED Discharge Orders          Ordered    amoxicillin -clavulanate (AUGMENTIN ) 875-125 MG tablet  2 times daily        03/14/24 1624             Note:  This document was prepared using Dragon voice recognition software and may include unintentional dictation errors.     Sheron Salm, PA-C 03/14/24 1650    Claudene Rover, MD 03/17/24 [phone]

## 2024-03-14 NOTE — Discharge Instructions (Addendum)
 You were seen in the emergency department for an animal bite.  Please follow-up with animal control over the next few days until the animal's 10-day quarantine is complete to ensure they do not show signs of rabies.  Please pick up the antibiotics and take as prescribed.  You may take this with over-the-counter pain relievers such as Tylenol  or ibuprofen based on the instructions on the bottle.  Clean the wound daily with soap and water and apply a new dressing until it is fully healed.  Please follow-up with your primary care provider within the next few days for recheck of your wound or return to the emergency department if you experience fever, intolerable pain, increasing redness or swelling, pus-like drainage, or numbness, to the affected area.

## 2024-03-16 ENCOUNTER — Ambulatory Visit: Payer: Self-pay | Admitting: Family Medicine

## 2024-04-09 DIAGNOSIS — H5203 Hypermetropia, bilateral: Secondary | ICD-10-CM | POA: Diagnosis not present

## 2024-04-28 ENCOUNTER — Ambulatory Visit

## 2024-04-28 VITALS — BP 134/87 | Ht 72.0 in | Wt 187.0 lb

## 2024-04-28 DIAGNOSIS — Z0000 Encounter for general adult medical examination without abnormal findings: Secondary | ICD-10-CM | POA: Diagnosis not present

## 2024-04-28 NOTE — Patient Instructions (Signed)
 [name],  Thank you for taking the time for your Medicare Wellness Visit. I appreciate your continued commitment to your health goals. Please review the care plan we discussed, and feel free to reach out if I can assist you further.  Please note that Annual Wellness Visits do not include a physical exam. Some assessments may be limited, especially if the visit was conducted virtually. If needed, we may recommend an in-person follow-up with your provider.  Ongoing Care Seeing your primary care provider every 3 to 6 months helps us  monitor your health and provide consistent, personalized care.   Referrals If a referral was made during today's visit and you haven't received any updates within two weeks, please contact the referred provider directly to check on the status.  Recommended Screenings:  Health Maintenance  Topic Date Due   Zoster (Shingles) Vaccine (1 of 2) 01/12/1999   Colon Cancer Screening  05/03/2023   COVID-19 Vaccine (5 - 2025-26 season) 01/28/2024   Medicare Annual Wellness Visit  02/20/2024   Stool Blood Test  03/11/2025   DTaP/Tdap/Td vaccine (2 - Td or Tdap) 03/14/2034   Pneumococcal Vaccine for age over 56  Completed   Flu Shot  Completed   Hepatitis C Screening  Completed   Meningitis B Vaccine  Aged Out       04/28/2024    3:20 PM  Advanced Directives  Does Patient Have a Medical Advance Directive? No  Would patient like information on creating a medical advance directive? No - Patient declined    Vision: Annual vision screenings are recommended for early detection of glaucoma, cataracts, and diabetic retinopathy. These exams can also reveal signs of chronic conditions such as diabetes and high blood pressure.  Dental: Annual dental screenings help detect early signs of oral cancer, gum disease, and other conditions linked to overall health, including heart disease and diabetes.  Please see the attached documents for additional preventive care  recommendations.

## 2024-04-28 NOTE — Progress Notes (Signed)
 I connected with  Robert Perez on 04/28/24 by a audio enabled telemedicine application and verified that I am speaking with the correct person using two identifiers.  Patient Location: Home  Provider Location: Home Office  Persons Participating in Visit: Patient.  I discussed the limitations of evaluation and management by telemedicine. The patient expressed understanding and agreed to proceed.  Vital Signs: Because this visit was a virtual/telehealth visit, some criteria may be missing or patient reported. Any vitals not documented were not able to be obtained and vitals that have been documented are patient reported.   Because this visit was a virtual/telehealth visit,  certain criteria was not obtained, such a blood pressure, CBG if applicable, and timed get up and go. Any medications not marked as taking were not mentioned during the medication reconciliation part of the visit. Any vitals not documented were not able to be obtained due to this being a telehealth visit or patient was unable to self-report a recent blood pressure reading due to a lack of equipment at home via telehealth. Vitals that have been documented are verbally provided by the patient.   This visit was performed by a medical professional under my direct supervision. I was immediately available for consultation/collaboration. I have reviewed and agree with the Annual Wellness Visit documentation.  Chief Complaint  Patient presents with   Medicare Wellness     Subjective:   Robert Perez is a 73 y.o. male who presents for a Medicare Annual Wellness Visit.  Visit info / Clinical Intake: Medicare Wellness Visit Type:: Subsequent Annual Wellness Visit Persons participating in visit and providing information:: patient Medicare Wellness Visit Mode:: Telephone If telephone:: video declined Since this visit was completed virtually, some vitals may be partially provided or unavailable. Missing vitals are due to the  limitations of the virtual format.: Documented vitals are patient reported If Telephone or Video please confirm:: I connected with patient using audio/video enable telemedicine. I verified patient identity with two identifiers, discussed telehealth limitations, and patient agreed to proceed. Patient Location:: home Provider Location:: home office Interpreter Needed?: No Pre-visit prep was completed: yes AWV questionnaire completed by patient prior to visit?: no Living arrangements:: with family/others Patient's Overall Health Status Rating: very good Typical amount of pain: none Does pain affect daily life?: no Are you currently prescribed opioids?: no  Dietary Habits and Nutritional Risks How many meals a day?: 2 Eats fruit and vegetables daily?: yes Most meals are obtained by: preparing own meals In the last 2 weeks, have you had any of the following?: none Diabetic:: no  Functional Status Activities of Daily Living (to include ambulation/medication): Independent Ambulation: Independent Medication Administration: Independent Home Management (perform basic housework or laundry): Independent Manage your own finances?: yes Primary transportation is: driving Concerns about vision?: no *vision screening is required for WTM* Concerns about hearing?: no  Fall Screening Falls in the past year?: 0 Number of falls in past year: 0 Was there an injury with Fall?: 0 Fall Risk Category Calculator: 0 Patient Fall Risk Level: Low Fall Risk  Fall Risk Patient at Risk for Falls Due to: No Fall Risks Fall risk Follow up: Falls evaluation completed; Education provided; Falls prevention discussed  Home and Transportation Safety: All rugs have non-skid backing?: N/A, no rugs All stairs or steps have railings?: yes Grab bars in the bathtub or shower?: (!) no Have non-skid surface in bathtub or shower?: yes Good home lighting?: yes Regular seat belt use?: yes Hospital stays in the last  year:: no  Cognitive Assessment Difficulty concentrating, remembering, or making decisions? : no Will 6CIT or Mini Cog be Completed: no 6CIT or Mini Cog Declined: patient alert, oriented, able to answer questions appropriately and recall recent events  Advance Directives (For Healthcare) Does Patient Have a Medical Advance Directive?: No Does patient want to make changes to medical advance directive?: No - Patient declined Type of Advance Directive: Healthcare Power of Attorney Copy of Healthcare Power of Attorney in Chart?: No - copy requested Would patient like information on creating a medical advance directive?: No - Patient declined  Reviewed/Updated  Reviewed/Updated: Reviewed All (Medical, Surgical, Family, Medications, Allergies, Care Teams, Patient Goals)    Allergies (verified) Codeine and Nitroglycerin    Current Medications (verified) Outpatient Encounter Medications as of 04/28/2024  Medication Sig   tamsulosin  (FLOMAX ) 0.4 MG CAPS capsule Take 1 capsule (0.4 mg total) by mouth daily.   No facility-administered encounter medications on file as of 04/28/2024.    History: Past Medical History:  Diagnosis Date   Allergic rhinitis due to pollen    Prostate cancer [facility]) urologist-  dr ottelin/oncologist-  dr patrcia   dx 12-05-2016-- Stage T1c,  Gleason 3+4,  PSA 7.78,  vol 33cc   PSA elevation    Wears glasses    Past Surgical History:  Procedure Laterality Date   COLONOSCOPY  2014   CYSTOSCOPY  03/16/2017   Procedure: CYSTOSCOPY;  Surgeon: Ceil Anes, MD;  Location: [facility];  Service: Urology;;  no seeds found in bladder   PROSTATE BIOPSY  12-05-2016   dr ceil office   RADIOACTIVE SEED IMPLANT N/A 03/16/2017   Procedure: RADIOACTIVE SEED IMPLANT/BRACHYTHERAPY IMPLANT WITH SPACE OAR INSTILLATION;  Surgeon: Ottelin, Mark, MD;  Location: [facility];  Service: Urology;  Laterality: N/A;    68 seeds implanted   Family History   Problem Relation Age of Onset   Diabetes Mother    Arthritis Father    Hyperlipidemia Father    Hypertension Father    Kidney disease Father    Prostate cancer Father        dx'd late in life.    Breast cancer Sister    Colon cancer Maternal Grandfather    Cancer Brother    Bladder Cancer Brother    Breast cancer Sister    Breast cancer Sister    Pancreatic cancer Neg Hx    Stomach cancer Neg Hx    Social History   Occupational History   Occupation: retired  Tobacco Use   Smoking status: Never   Smokeless tobacco: Never  Vaping Use   Vaping status: Never Used  Substance and Sexual Activity   Alcohol use: No    Alcohol/week: 0.0 standard drinks of alcohol   Drug use: No   Sexual activity: Never   Tobacco Counseling Counseling given: Not Answered  SDOH Screenings   Food Insecurity: No Food Insecurity (04/28/2024)  Housing: Low Risk  (04/28/2024)  Transportation Needs: No Transportation Needs (04/28/2024)  Utilities: Not At Risk (04/28/2024)  Alcohol Screen: Low Risk  (02/20/2023)  Depression (PHQ2-9): Low Risk  (04/28/2024)  Financial Resource Strain: Low Risk  (02/20/2023)  Physical Activity: Sufficiently Active (04/28/2024)  Social Connections: Moderately Integrated (04/28/2024)  Stress: No Stress Concern Present (04/28/2024)  Tobacco Use: Low Risk  (04/28/2024)  Health Literacy: Adequate Health Literacy (04/28/2024)   See flowsheets for full screening details  Depression Screen PHQ 2 & 9 Depression Scale- Over the past 2 weeks, how often have you  been bothered by any of the following problems? Little interest or pleasure in doing things: 2 Feeling down, depressed, or hopeless (PHQ Adolescent also includes...irritable): 0 PHQ-2 Total Score: 2 Trouble falling or staying asleep, or sleeping too much: 0 Feeling tired or having little energy: 0 Poor appetite or overeating (PHQ Adolescent also includes...weight loss): 0 Feeling bad about yourself - or that you are a  failure or have let yourself or your family down: 0 Trouble concentrating on things, such as reading the newspaper or watching television (PHQ Adolescent also includes...like school work): 0 Moving or speaking so slowly that other people could have noticed. Or the opposite - being so fidgety or restless that you have been moving around a lot more than usual: 0 Thoughts that you would be better off dead, or of hurting yourself in some way: 0 PHQ-9 Total Score: 2 If you checked off any problems, how difficult have these problems made it for you to do your work, take care of things at home, or get along with other people?: Not difficult at all  Depression Treatment Depression Interventions/Treatment : [id number] Score <4 Follow-up Not Indicated     Goals Addressed             This Visit's Progress    Patient Stated       Patient would like to lose some weight             Objective:    Today's Vitals   04/28/24 1517  BP: 134/87  Weight: 187 lb (84.8 kg)  Height: 6' (1.829 m)   Body mass index is 25.36 kg/m.  Hearing/Vision screen Hearing Screening - Comments:: No hearing aids some hearing loss Vision Screening - Comments:: Wears glasses, [facility] in [place] Immunizations and Health Maintenance Health Maintenance  Topic Date Due   Zoster Vaccines- Shingrix (1 of 2) 01/12/1999   Colonoscopy  05/03/2023   COVID-19 Vaccine (5 - 2025-26 season) 01/28/2024   Medicare Annual Wellness (AWV)  02/20/2024   COLON CANCER SCREENING ANNUAL FOBT  03/11/2025   DTaP/Tdap/Td (2 - Td or Tdap) 03/14/2034   Pneumococcal Vaccine: 50+ Years  Completed   Influenza Vaccine  Completed   Hepatitis C Screening  Completed   Meningococcal B Vaccine  Aged Out        Assessment/Plan:  This is a routine wellness examination for Robert Perez.  Patient Care Team: Cleatus Arlyss RAMAN, MD as PCP - General (Family Medicine) Selma Donnice SAUNDERS, MD as Consulting Physician (Urology) Tobie Franky SQUIBB, DPM as  Consulting Physician (Podiatry)  I have personally reviewed and noted the following in the patient's chart:   Medical and social history Use of alcohol, tobacco or illicit drugs  Current medications and supplements including opioid prescriptions. Functional ability and status Nutritional status Physical activity Advanced directives List of other physicians Hospitalizations, surgeries, and ER visits in previous 12 months Vitals Screenings to include cognitive, depression, and falls Referrals and appointments  No orders of the defined types were placed in this encounter.  In addition, I have reviewed and discussed with patient certain preventive protocols, quality metrics, and best practice recommendations. A written personalized care plan for preventive services as well as general preventive health recommendations were provided to patient.   Robert Perez Right, [place]   04/28/2024   No follow-ups on file.  After Visit Summary: (MyChart) Due to this being a telephonic visit, the after visit summary with patients personalized plan was offered to patient via MyChart  Nurse Notes: nothing to report
